# Patient Record
Sex: Male | Born: 1965 | Smoking: Current every day smoker
Health system: Southern US, Community
[De-identification: ages and names within clinical notes are randomized; demographics above are authoritative.]

## PROBLEM LIST (undated history)

## (undated) DIAGNOSIS — K219 Gastro-esophageal reflux disease without esophagitis: Secondary | ICD-10-CM

## (undated) DIAGNOSIS — F329 Major depressive disorder, single episode, unspecified: Secondary | ICD-10-CM

## (undated) DIAGNOSIS — M1990 Unspecified osteoarthritis, unspecified site: Secondary | ICD-10-CM

## (undated) DIAGNOSIS — J189 Pneumonia, unspecified organism: Secondary | ICD-10-CM

## (undated) DIAGNOSIS — F32A Depression, unspecified: Secondary | ICD-10-CM

---

## 2013-02-12 HISTORY — PX: OTHER SURGICAL HISTORY: SHX169

## 2018-01-28 ENCOUNTER — Encounter: Payer: Self-pay | Admitting: Student

## 2018-01-28 NOTE — H&P (Signed)
TOTAL HIP ADMISSION H&P  Patient is admitted for right total hip arthroplasty.  Subjective:  Chief Complaint: right hip pain  HPI: Troy Cruz, 52 y.o. male, has a history of pain and functional disability in the right hip(s) due to arthritis and patient has failed non-surgical conservative treatments for greater than 12 weeks to include corticosteriod injections and activity modification.  Onset of symptoms was gradual starting several years ago with gradually worsening course since that time.The patient noted no past surgery on the right hip(s).  Patient currently rates pain in the right hip at 10 out of 10 with activity. Patient has night pain, worsening of pain with activity and weight bearing and pain that interfers with activities of daily living. 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 This condition presents safety issues increasing the risk of falls. There is no current active infection.  There are no active problems to display for this patient.  History reviewed. No pertinent past medical history.  History reviewed. No pertinent surgical history.  No current facility-administered medications for this encounter.    No current outpatient medications on file.   Allergies not on file  Social History   Tobacco Use  . Smoking status: Not on file  Substance Use Topics  . Alcohol use: Not on file    History reviewed. No pertinent family history.   Review of Systems  Constitutional: Negative for chills and fever.  HENT: Negative for congestion, sore throat and tinnitus.   Eyes: Negative for double vision, photophobia and pain.  Respiratory: Negative for cough, shortness of breath and wheezing.   Cardiovascular: Negative for chest pain, palpitations and orthopnea.  Gastrointestinal: Negative for heartburn, nausea and vomiting.  Genitourinary: Negative for dysuria, frequency  and urgency.  Musculoskeletal: Positive for joint pain.  Neurological: Negative for dizziness, weakness and headaches.    Objective:  Physical Exam  Well nourished and well developed.  General: Alert and oriented x3, cooperative and pleasant, no acute distress.  Head: normocephalic, atraumatic, neck supple.  Eyes: EOMI.  Respiratory: breath sounds clear in all fields, no wheezing, rales, or rhonchi. Cardiovascular: Regular rate and rhythm, no murmurs, gallops or rubs.  Abdomen: non-tender to palpation and soft, normoactive bowel sounds. Musculoskeletal: Right Hip Exam: ROM: Flexion to 90, Internal Rotation 0, External Rotation 10, and abduction 10 without pain during any type of motion. There is no tenderness over the greater trochanter bursa. Left Hip Exam: ROM: is normal without discomfort. There is no tenderness over the greater trochanter bursa. There is no pain on provocative testing of the hip. Calves soft and nontender. Motor function intact in LE. Strength 5/5 LE bilaterally. Neuro: Distal pulses 2+. Sensation to light touch intact in LE.  Vital signs in last 24 hours: Blood pressure: 118/84 mmHg Pulse: 60 bpm  Labs:   There is no height or weight on file to calculate BMI.   Imaging Review Plain radiographs demonstrate severe degenerative joint disease of the right hip(s). The bone quality appears to be adequate for age and reported activity level.    Preoperative templating of the joint replacement has been completed, documented, and submitted to the Operating Room personnel in order to optimize intra-operative equipment management.     Assessment/Plan:  End stage arthritis, right hip(s)  The patient history, physical examination, clinical judgement of the provider and imaging studies are consistent with end stage degenerative joint disease of the right hip(s) and total hip arthroplasty  is deemed medically necessary. The treatment options including medical  management, injection therapy, arthroscopy and arthroplasty were discussed at length. The risks and benefits of total hip arthroplasty were presented and reviewed. The risks due to aseptic loosening, infection, stiffness, dislocation/subluxation,  thromboembolic complications and other imponderables were discussed.  The patient acknowledged the explanation, agreed to proceed with the plan and consent was signed. Patient is being admitted for inpatient treatment for surgery, pain control, PT, OT, prophylactic antibiotics, VTE prophylaxis, progressive ambulation and ADL's and discharge planning.The patient is planning to be discharged home.   Therapy Plans: HEP Disposition: Home with wife Planned DVT Prophylaxis: Xarelto 10 mg daily (hx gastric ulcer) DME needed: Dan HumphreysWalker, 3-n-1 PCP: Dr. Christean GriefWhitten TXA: IV Allergies: NKDA Anesthesia Concerns: None  - Patient was instructed on what medications to stop prior to surgery. - Follow-up visit in 2 weeks with Dr. Lequita HaltAluisio - Begin physical therapy following surgery - Pre-operative lab work as pre-surgical testing - Prescriptions will be provided in hospital at time of discharge  Arther AbbottKristie Edmisten, PA-C Orthopedic Surgery [facility] [place]

## 2018-02-10 ENCOUNTER — Other Ambulatory Visit (HOSPITAL_COMMUNITY): Payer: Self-pay | Admitting: *Deleted

## 2018-02-10 NOTE — Patient Instructions (Addendum)
Troy Cruz  02/10/2018   Your procedure is scheduled on: 02-19-18  [facility]  Report to admitting at 930 AM    Call this number if you have problems the morning of surgery [phone]   Remember: Do not eat food or drink liquids :After Midnight. BRUSH YOUR TEETH MORNING OF SURGERY AND RINSE YOUR MOUTH OUT, NO CHEWING GUM CANDY OR MINTS.     Take these medicines the morning of surgery with A SIP OF WATER: omeprazole                               You may not have any metal on your body including hair pins and              piercings  Do not wear jewelry, make-up, lotions, powders or perfumes, deodorant             Do not wear nail polish.  Do not shave  48 hours prior to surgery.              Men may shave face and neck.   Do not bring valuables to the hospital. [place] IS NOT             RESPONSIBLE   FOR VALUABLES.  Contacts, dentures or bridgework may not be worn into surgery.  Leave suitcase in the car. After surgery it may be brought to your room.                  Please read over the following fact sheets you were given: _____________________________________________________________________             [facility] Pre[facility] Before surgery, you can play an important role.  Because skin is not sterile, your skin needs to be as free of germs as possible.  You can reduce the number of germs on your skin by washing with CHG (chlorahexidine gluconate) soap before surgery.  CHG is an antiseptic cleaner which kills germs and bonds with the skin to continue killing germs even after washing. Please DO NOT use if you have an allergy to CHG or antibacterial soaps.  If your skin becomes reddened/irritated stop using the CHG and inform your nurse when you arrive at Short Stay. Do not shave (including legs and underarms) for at least 48 hours prior to the first CHG shower.  You may shave your face/neck. Please follow  these instructions carefully:  1.  Shower with CHG Soap the night before surgery and the  morning of Surgery.  2.  If you choose to wash your hair, wash your hair first as usual with your  normal  shampoo.  3.  After you shampoo, rinse your hair and body thoroughly to remove the  shampoo.                           4.  Use CHG as you would any other liquid soap.  You can apply chg directly  to the skin and wash                       Gently with a scrungie or clean washcloth.  5.  Apply the CHG Soap to your body ONLY FROM THE NECK DOWN.  Do not use on face/ open                           Wound or open sores. Avoid contact with eyes, ears mouth and genitals (private parts).                       Wash face,  Genitals (private parts) with your normal soap.             6.  Wash thoroughly, paying special attention to the area where your surgery  will be performed.  7.  Thoroughly rinse your body with warm water from the neck down.  8.  DO NOT shower/wash with your normal soap after using and rinsing off  the CHG Soap.                9.  Pat yourself dry with a clean towel.            10.  Wear clean pajamas.            11.  Place clean sheets on your bed the night of your first shower and do not  sleep with pets. Day of Surgery : Do not apply any lotions/deodorants the morning of surgery.  Please wear clean clothes to the hospital/surgery center.  FAILURE TO FOLLOW THESE INSTRUCTIONS MAY RESULT IN THE CANCELLATION OF YOUR SURGERY PATIENT SIGNATURE_________________________________  NURSE SIGNATURE__________________________________  ________________________________________________________________________   Adam Phenix  An incentive spirometer is a tool that can help keep your lungs clear and active. This tool measures how well you are filling your lungs with each breath. Taking long deep breaths may help reverse or decrease the chance of developing breathing (pulmonary) problems  (especially infection) following:  A long period of time when you are unable to move or be active. BEFORE THE PROCEDURE   If the spirometer includes an indicator to show your best effort, your nurse or respiratory therapist will set it to a desired goal.  If possible, sit up straight or lean slightly forward. Try not to slouch.  Hold the incentive spirometer in an upright position. INSTRUCTIONS FOR USE  1. Sit on the edge of your bed if possible, or sit up as far as you can in bed or on a chair. 2. Hold the incentive spirometer in an upright position. 3. Breathe out normally. 4. Place the mouthpiece in your mouth and seal your lips tightly around it. 5. Breathe in slowly and as deeply as possible, raising the piston or the ball toward the top of the column. 6. Hold your breath for 3-5 seconds or for as long as possible. Allow the piston or ball to fall to the bottom of the column. 7. Remove the mouthpiece from your mouth and breathe out normally. 8. Rest for a few seconds and repeat Steps 1 through 7 at least 10 times every 1-2 hours when you are awake. Take your time and take a few normal breaths between deep breaths. 9. The spirometer may include an indicator to show your best effort. Use the indicator as a goal to work toward during each repetition. 10. After each set of 10 deep breaths, practice coughing to be sure your lungs are clear. If you have an incision (the cut made at the time of surgery), support your incision when coughing by placing a pillow or rolled up towels firmly against it. Once you are able to get out of  bed, walk around indoors and cough well. You may stop using the incentive spirometer when instructed by your caregiver.  RISKS AND COMPLICATIONS  Take your time so you do not get dizzy or light-headed.  If you are in pain, you may need to take or ask for pain medication before doing incentive spirometry. It is harder to take a deep breath if you are having  pain. AFTER USE  Rest and breathe slowly and easily.  It can be helpful to keep track of a log of your progress. Your caregiver can provide you with a simple table to help with this. If you are using the spirometer at home, follow these instructions: [place] IF:   You are having difficultly using the spirometer.  You have trouble using the spirometer as often as instructed.  Your pain medication is not giving enough relief while using the spirometer.  You develop fever of 100.5 F (38.1 C) or higher. SEEK IMMEDIATE MEDICAL CARE IF:   You cough up bloody sputum that had not been present before.  You develop fever of 102 F (38.9 C) or greater.  You develop worsening pain at or near the incision site. MAKE SURE YOU:   Understand these instructions.  Will watch your condition.  Will get help right away if you are not doing well or get worse. Document Released: 06/11/2006 Document Revised: 04/23/2011 Document Reviewed: 08/12/2006 ExitCare Patient Information 2014 ExitCare, [place].   ________________________________________________________________________  WHAT IS A BLOOD TRANSFUSION? Blood Transfusion Information  A transfusion is the replacement of blood or some of its parts. Blood is made up of multiple cells which provide different functions.  Red blood cells carry oxygen and are used for blood loss replacement.  White blood cells fight against infection.  Platelets control bleeding.  Plasma helps clot blood.  Other blood products are available for specialized needs, such as hemophilia or other clotting disorders. BEFORE THE TRANSFUSION  Who gives blood for transfusions?   Healthy volunteers who are fully evaluated to make sure their blood is safe. This is blood bank blood. Transfusion therapy is the safest it has ever been in the practice of medicine. Before blood is taken from a donor, a complete history is taken to make sure that person has no history  of diseases nor engages in risky social behavior (examples are intravenous drug use or sexual activity with multiple partners). The donor's travel history is screened to minimize risk of transmitting infections, such as malaria. The donated blood is tested for signs of infectious diseases, such as HIV and hepatitis. The blood is then tested to be sure it is compatible with you in order to minimize the chance of a transfusion reaction. If you or a relative donates blood, this is often done in anticipation of surgery and is not appropriate for emergency situations. It takes many days to process the donated blood. RISKS AND COMPLICATIONS Although transfusion therapy is very safe and saves many lives, the main dangers of transfusion include:   Getting an infectious disease.  Developing a transfusion reaction. This is an allergic reaction to something in the blood you were given. Every precaution is taken to prevent this. The decision to have a blood transfusion has been considered carefully by your caregiver before blood is given. Blood is not given unless the benefits outweigh the risks. AFTER THE TRANSFUSION  Right after receiving a blood transfusion, you will usually feel much better and more energetic. This is especially true if your red blood  cells have gotten low (anemic). The transfusion raises the level of the red blood cells which carry oxygen, and this usually causes an energy increase.  The nurse administering the transfusion will monitor you carefully for complications. HOME CARE INSTRUCTIONS  No special instructions are needed after a transfusion. You may find your energy is better. Speak with your caregiver about any limitations on activity for underlying diseases you may have. SEEK MEDICAL CARE IF:   Your condition is not improving after your transfusion.  You develop redness or irritation at the intravenous (IV) site. SEEK IMMEDIATE MEDICAL CARE IF:  Any of the following symptoms  occur over the next 12 hours:  Shaking chills.  You have a temperature by mouth above 102 F (38.9 C), not controlled by medicine.  Chest, back, or muscle pain.  People around you feel you are not acting correctly or are confused.  Shortness of breath or difficulty breathing.  Dizziness and fainting.  You get a rash or develop hives.  You have a decrease in urine output.  Your urine turns a dark color or changes to pink, red, or brown. Any of the following symptoms occur over the next 10 days:  You have a temperature by mouth above 102 F (38.9 C), not controlled by medicine.  Shortness of breath.  Weakness after normal activity.  The white part of the eye turns yellow (jaundice).  You have a decrease in the amount of urine or are urinating less often.  Your urine turns a dark color or changes to pink, red, or brown. Document Released: 01/27/2000 Document Revised: 04/23/2011 Document Reviewed: 09/15/2007 [facility] Patient Information 2014 [place], [place].  _______________________________________________________________________

## 2018-02-14 ENCOUNTER — Encounter (HOSPITAL_COMMUNITY)
Admission: RE | Admit: 2018-02-14 | Discharge: 2018-02-14 | Disposition: A | Discharge status: Home / Self Care | Payer: Commercial Managed Care - PPO | Source: Ambulatory Visit | Attending: Orthopedic Surgery | Admitting: Orthopedic Surgery

## 2018-02-14 ENCOUNTER — Encounter (HOSPITAL_COMMUNITY): Payer: Self-pay

## 2018-02-14 ENCOUNTER — Other Ambulatory Visit: Payer: Self-pay

## 2018-02-14 DIAGNOSIS — M1611 Unilateral primary osteoarthritis, right hip: Secondary | ICD-10-CM | POA: Insufficient documentation

## 2018-02-14 DIAGNOSIS — Z01812 Encounter for preprocedural laboratory examination: Secondary | ICD-10-CM | POA: Insufficient documentation

## 2018-02-14 HISTORY — DX: Depression, unspecified: F32.A

## 2018-02-14 HISTORY — DX: Major depressive disorder, single episode, unspecified: F32.9

## 2018-02-14 HISTORY — DX: Unspecified osteoarthritis, unspecified site: M19.90

## 2018-02-14 HISTORY — DX: Pneumonia, unspecified organism: J18.9

## 2018-02-14 HISTORY — DX: Gastro-esophageal reflux disease without esophagitis: K21.9

## 2018-02-14 LAB — APTT: APTT: 35 s (ref 24–36)

## 2018-02-14 LAB — COMPREHENSIVE METABOLIC PANEL
ALBUMIN: 4 g/dL (ref 3.5–5.0)
ALK PHOS: 92 U/L (ref 38–126)
ALT: 19 U/L (ref 0–44)
ANION GAP: 12 (ref 5–15)
AST: 26 U/L (ref 15–41)
BUN: 12 mg/dL (ref 6–20)
CHLORIDE: 103 mmol/L (ref 98–111)
CO2: 24 mmol/L (ref 22–32)
Calcium: 9.5 mg/dL (ref 8.9–10.3)
Creatinine, Ser: 0.9 mg/dL (ref 0.61–1.24)
GFR calc Af Amer: >60 mL/min (ref >60)
GFR calc non Af Amer: >60 mL/min (ref >60)
GLUCOSE: 93 mg/dL (ref 70–99)
POTASSIUM: 4.7 mmol/L (ref 3.5–5.1)
SODIUM: 139 mmol/L (ref 135–145)
Total Bilirubin: 0.5 mg/dL (ref 0.3–1.2)
Total Protein: 7.3 g/dL (ref 6.5–8.1)

## 2018-02-14 LAB — CBC
HCT: 47.5 % (ref 39.0–52.0)
HEMOGLOBIN: 15.4 g/dL (ref 13.0–17.0)
MCH: 28.6 pg (ref 26.0–34.0)
MCHC: 32.4 g/dL (ref 30.0–36.0)
MCV: 88.3 fL (ref 80.0–100.0)
Platelets: 219 10*3/uL (ref 150–400)
RBC: 5.38 MIL/uL (ref 4.22–5.81)
RDW: 14.2 % (ref 11.5–15.5)
WBC: 10.8 10*3/uL — AB (ref 4.0–10.5)
nRBC: 0 % (ref 0.0–0.2)

## 2018-02-14 LAB — PROTIME-INR
INR: 0.92
Prothrombin Time: 12.3 seconds (ref 11.4–15.2)

## 2018-02-14 LAB — ABO/RH: ABO/RH(D): A POS

## 2018-02-14 LAB — SURGICAL PCR SCREEN
MRSA, PCR: NEGATIVE
Staphylococcus aureus: NEGATIVE

## 2018-02-19 ENCOUNTER — Inpatient Hospital Stay (HOSPITAL_COMMUNITY): Payer: Commercial Managed Care - PPO | Admitting: Certified Registered Nurse Anesthetist

## 2018-02-19 ENCOUNTER — Inpatient Hospital Stay (HOSPITAL_COMMUNITY)
Admission: RE | Admit: 2018-02-19 | Discharge: 2018-02-20 | DRG: 470 | Disposition: A | Discharge status: Home / Self Care | Payer: Commercial Managed Care - PPO | Attending: Orthopedic Surgery | Admitting: Orthopedic Surgery

## 2018-02-19 ENCOUNTER — Other Ambulatory Visit: Payer: Self-pay

## 2018-02-19 ENCOUNTER — Inpatient Hospital Stay (HOSPITAL_COMMUNITY): Payer: Commercial Managed Care - PPO

## 2018-02-19 ENCOUNTER — Encounter (HOSPITAL_COMMUNITY): Payer: Self-pay | Admitting: Certified Registered Nurse Anesthetist

## 2018-02-19 ENCOUNTER — Encounter (HOSPITAL_COMMUNITY)
Admission: RE | Disposition: A | Discharge status: Home / Self Care | Payer: Self-pay | Source: Home / Self Care | Attending: Orthopedic Surgery

## 2018-02-19 ENCOUNTER — Inpatient Hospital Stay (HOSPITAL_COMMUNITY): Payer: Commercial Managed Care - PPO | Admitting: Physician Assistant

## 2018-02-19 DIAGNOSIS — K219 Gastro-esophageal reflux disease without esophagitis: Secondary | ICD-10-CM | POA: Diagnosis present

## 2018-02-19 DIAGNOSIS — M1611 Unilateral primary osteoarthritis, right hip: Principal | ICD-10-CM | POA: Diagnosis present

## 2018-02-19 DIAGNOSIS — M25761 Osteophyte, right knee: Secondary | ICD-10-CM | POA: Diagnosis present

## 2018-02-19 DIAGNOSIS — F17200 Nicotine dependence, unspecified, uncomplicated: Secondary | ICD-10-CM | POA: Diagnosis present

## 2018-02-19 DIAGNOSIS — Z8711 Personal history of peptic ulcer disease: Secondary | ICD-10-CM | POA: Diagnosis not present

## 2018-02-19 DIAGNOSIS — Z972 Presence of dental prosthetic device (complete) (partial): Secondary | ICD-10-CM

## 2018-02-19 DIAGNOSIS — F329 Major depressive disorder, single episode, unspecified: Secondary | ICD-10-CM | POA: Diagnosis present

## 2018-02-19 DIAGNOSIS — Z96649 Presence of unspecified artificial hip joint: Secondary | ICD-10-CM

## 2018-02-19 DIAGNOSIS — M169 Osteoarthritis of hip, unspecified: Secondary | ICD-10-CM | POA: Diagnosis present

## 2018-02-19 DIAGNOSIS — Z419 Encounter for procedure for purposes other than remedying health state, unspecified: Secondary | ICD-10-CM

## 2018-02-19 HISTORY — PX: TOTAL HIP ARTHROPLASTY: SHX124

## 2018-02-19 LAB — TYPE AND SCREEN
ABO/RH(D): A POS
ANTIBODY SCREEN: NEGATIVE

## 2018-02-19 SURGERY — ARTHROPLASTY, HIP, TOTAL, ANTERIOR APPROACH
Anesthesia: Monitor Anesthesia Care | Site: Hip | Laterality: Right

## 2018-02-19 MED ORDER — CHLORHEXIDINE GLUCONATE 4 % EX LIQD: 60.0000 mL | Freq: Once | CUTANEOUS | Status: DC

## 2018-02-19 MED ORDER — CEFAZOLIN SODIUM-DEXTROSE 1-4 GM/50ML-% IV SOLN
1.0000 g | Freq: Four times a day (QID) | INTRAVENOUS | Status: AC
  Administered 2018-02-19 (×2): 1 g via INTRAVENOUS
  Filled 2018-02-19 (×2): qty 50

## 2018-02-19 MED ORDER — FENTANYL CITRATE (PF) 100 MCG/2ML IJ SOLN: 25.0000 ug | INTRAMUSCULAR | Status: DC | PRN

## 2018-02-19 MED ORDER — STERILE WATER FOR IRRIGATION IR SOLN
Status: DC | PRN
  Administered 2018-02-19: 2000 mL

## 2018-02-19 MED ORDER — FLEET ENEMA 7-19 GM/118ML RE ENEM: 1.0000 | ENEMA | Freq: Once | RECTAL | Status: DC | PRN

## 2018-02-19 MED ORDER — PANTOPRAZOLE SODIUM 40 MG PO TBEC
80.0000 mg | DELAYED_RELEASE_TABLET | Freq: Every day | ORAL | Status: DC
  Administered 2018-02-20: 80 mg via ORAL
  Filled 2018-02-19: qty 2

## 2018-02-19 MED ORDER — FLUTICASONE PROPIONATE 50 MCG/ACT NA SUSP
2.0000 | Freq: Every day | NASAL | Status: DC | PRN
  Filled 2018-02-19: qty 16

## 2018-02-19 MED ORDER — MENTHOL 3 MG MT LOZG: 1.0000 | LOZENGE | OROMUCOSAL | Status: DC | PRN

## 2018-02-19 MED ORDER — CEFAZOLIN SODIUM-DEXTROSE 2-4 GM/100ML-% IV SOLN
2.0000 g | INTRAVENOUS | Status: AC
  Administered 2018-02-19: 2 g via INTRAVENOUS
  Filled 2018-02-19: qty 100

## 2018-02-19 MED ORDER — BISACODYL 10 MG RE SUPP: 10.0000 mg | Freq: Every day | RECTAL | Status: DC | PRN

## 2018-02-19 MED ORDER — HYDROCODONE-ACETAMINOPHEN 7.5-325 MG PO TABS
1.0000 | ORAL_TABLET | ORAL | Status: DC | PRN
  Administered 2018-02-19 – 2018-02-20 (×3): 2 via ORAL
  Filled 2018-02-19 (×3): qty 2

## 2018-02-19 MED ORDER — SODIUM CHLORIDE 0.9 % IV SOLN
INTRAVENOUS | Status: DC
  Administered 2018-02-19 (×2): via INTRAVENOUS

## 2018-02-19 MED ORDER — PROPOFOL 500 MG/50ML IV EMUL
INTRAVENOUS | Status: DC | PRN
  Administered 2018-02-19: 125 ug/kg/min via INTRAVENOUS

## 2018-02-19 MED ORDER — ACETAMINOPHEN 160 MG/5ML PO SOLN: 1000.0000 mg | Freq: Once | ORAL | Status: DC | PRN

## 2018-02-19 MED ORDER — PHENOL 1.4 % MT LIQD
1.0000 | OROMUCOSAL | Status: DC | PRN
  Filled 2018-02-19: qty 177

## 2018-02-19 MED ORDER — ACETAMINOPHEN 500 MG PO TABS
500.0000 mg | ORAL_TABLET | Freq: Four times a day (QID) | ORAL | Status: DC
  Administered 2018-02-19 (×2): 500 mg via ORAL
  Filled 2018-02-19 (×3): qty 1

## 2018-02-19 MED ORDER — TRANEXAMIC ACID-NACL 1000-0.7 MG/100ML-% IV SOLN
1000.0000 mg | Freq: Once | INTRAVENOUS | Status: AC
  Administered 2018-02-19: 1000 mg via INTRAVENOUS
  Filled 2018-02-19: qty 100

## 2018-02-19 MED ORDER — DEXAMETHASONE SODIUM PHOSPHATE 10 MG/ML IJ SOLN
8.0000 mg | Freq: Once | INTRAMUSCULAR | Status: AC
  Administered 2018-02-19: 10 mg via INTRAVENOUS

## 2018-02-19 MED ORDER — BUPIVACAINE-EPINEPHRINE (PF) 0.25% -1:200000 IJ SOLN
INTRAMUSCULAR | Status: DC | PRN
  Administered 2018-02-19: 30 mL

## 2018-02-19 MED ORDER — ONDANSETRON HCL 4 MG/2ML IJ SOLN
INTRAMUSCULAR | Status: DC | PRN
  Administered 2018-02-19: 4 mg via INTRAVENOUS

## 2018-02-19 MED ORDER — PROPOFOL 10 MG/ML IV BOLUS
INTRAVENOUS | Status: AC
  Filled 2018-02-19: qty 60

## 2018-02-19 MED ORDER — ACETAMINOPHEN 10 MG/ML IV SOLN: 1000.0000 mg | Freq: Once | INTRAVENOUS | Status: DC | PRN

## 2018-02-19 MED ORDER — RIVAROXABAN 10 MG PO TABS
10.0000 mg | ORAL_TABLET | Freq: Every day | ORAL | Status: DC
  Administered 2018-02-20: 10 mg via ORAL
  Filled 2018-02-19: qty 1

## 2018-02-19 MED ORDER — OXYCODONE HCL 5 MG/5ML PO SOLN
5.0000 mg | Freq: Once | ORAL | Status: DC | PRN
  Filled 2018-02-19: qty 5

## 2018-02-19 MED ORDER — METOCLOPRAMIDE HCL 5 MG PO TABS: 5.0000 mg | ORAL_TABLET | Freq: Three times a day (TID) | ORAL | Status: DC | PRN

## 2018-02-19 MED ORDER — DOCUSATE SODIUM 100 MG PO CAPS
100.0000 mg | ORAL_CAPSULE | Freq: Two times a day (BID) | ORAL | Status: DC
  Administered 2018-02-20: 100 mg via ORAL
  Filled 2018-02-19: qty 1

## 2018-02-19 MED ORDER — METHOCARBAMOL 500 MG IVPB - SIMPLE MED
500.0000 mg | Freq: Four times a day (QID) | INTRAVENOUS | Status: DC | PRN
  Filled 2018-02-19: qty 50

## 2018-02-19 MED ORDER — ACETAMINOPHEN 10 MG/ML IV SOLN
1000.0000 mg | Freq: Four times a day (QID) | INTRAVENOUS | Status: DC
  Administered 2018-02-19: 1000 mg via INTRAVENOUS
  Filled 2018-02-19: qty 100

## 2018-02-19 MED ORDER — METOCLOPRAMIDE HCL 5 MG/ML IJ SOLN: 5.0000 mg | Freq: Three times a day (TID) | INTRAMUSCULAR | Status: DC | PRN

## 2018-02-19 MED ORDER — PROPOFOL 10 MG/ML IV BOLUS
INTRAVENOUS | Status: DC | PRN
  Administered 2018-02-19: 40 mg via INTRAVENOUS

## 2018-02-19 MED ORDER — HYDROCODONE-ACETAMINOPHEN 5-325 MG PO TABS
1.0000 | ORAL_TABLET | ORAL | Status: DC | PRN
  Administered 2018-02-19: 2 via ORAL
  Filled 2018-02-19: qty 2

## 2018-02-19 MED ORDER — POLYETHYLENE GLYCOL 3350 17 G PO PACK: 17.0000 g | PACK | Freq: Every day | ORAL | Status: DC | PRN

## 2018-02-19 MED ORDER — BUPIVACAINE-EPINEPHRINE (PF) 0.25% -1:200000 IJ SOLN
INTRAMUSCULAR | Status: AC
  Filled 2018-02-19: qty 30

## 2018-02-19 MED ORDER — ACETAMINOPHEN 500 MG PO TABS: 1000.0000 mg | ORAL_TABLET | Freq: Once | ORAL | Status: DC | PRN

## 2018-02-19 MED ORDER — ONDANSETRON HCL 4 MG/2ML IJ SOLN: 4.0000 mg | Freq: Four times a day (QID) | INTRAMUSCULAR | Status: DC | PRN

## 2018-02-19 MED ORDER — TRANEXAMIC ACID-NACL 1000-0.7 MG/100ML-% IV SOLN
1000.0000 mg | INTRAVENOUS | Status: AC
  Administered 2018-02-19: 1000 mg via INTRAVENOUS
  Filled 2018-02-19: qty 100

## 2018-02-19 MED ORDER — MORPHINE SULFATE (PF) 2 MG/ML IV SOLN: 0.5000 mg | INTRAVENOUS | Status: DC | PRN

## 2018-02-19 MED ORDER — DIPHENHYDRAMINE HCL 12.5 MG/5ML PO ELIX: 12.5000 mg | ORAL_SOLUTION | ORAL | Status: DC | PRN

## 2018-02-19 MED ORDER — DEXAMETHASONE SODIUM PHOSPHATE 10 MG/ML IJ SOLN
10.0000 mg | Freq: Once | INTRAMUSCULAR | Status: AC
  Administered 2018-02-20: 10 mg via INTRAVENOUS
  Filled 2018-02-19: qty 1

## 2018-02-19 MED ORDER — ONDANSETRON HCL 4 MG PO TABS: 4.0000 mg | ORAL_TABLET | Freq: Four times a day (QID) | ORAL | Status: DC | PRN

## 2018-02-19 MED ORDER — LACTATED RINGERS IV SOLN
INTRAVENOUS | Status: DC
  Administered 2018-02-19 (×2): via INTRAVENOUS

## 2018-02-19 MED ORDER — 0.9 % SODIUM CHLORIDE (POUR BTL) OPTIME
TOPICAL | Status: DC | PRN
  Administered 2018-02-19: 1000 mL

## 2018-02-19 MED ORDER — OXYCODONE HCL 5 MG PO TABS: 5.0000 mg | ORAL_TABLET | Freq: Once | ORAL | Status: DC | PRN

## 2018-02-19 MED ORDER — METHOCARBAMOL 500 MG PO TABS
500.0000 mg | ORAL_TABLET | Freq: Four times a day (QID) | ORAL | Status: DC | PRN
  Administered 2018-02-19 – 2018-02-20 (×2): 500 mg via ORAL
  Filled 2018-02-19 (×2): qty 1

## 2018-02-19 MED ORDER — BUPIVACAINE IN DEXTROSE 0.75-8.25 % IT SOLN
INTRATHECAL | Status: DC | PRN
  Administered 2018-02-19: 15 mg via INTRATHECAL

## 2018-02-19 MED ORDER — TERBINAFINE HCL 250 MG PO TABS
250.0000 mg | ORAL_TABLET | Freq: Every evening | ORAL | Status: DC
  Administered 2018-02-19: 250 mg via ORAL
  Filled 2018-02-19: qty 1

## 2018-02-19 SURGICAL SUPPLY — 42 items
BAG DECANTER FOR FLEXI CONT (MISCELLANEOUS) ×3 IMPLANT
BAG ZIPLOCK 12X15 (MISCELLANEOUS) IMPLANT
BLADE SAG 18X100X1.27 (BLADE) ×3 IMPLANT
BLADE SURG SZ10 CARB STEEL (BLADE) ×6 IMPLANT
CLOSURE WOUND 1/2 X4 (GAUZE/BANDAGES/DRESSINGS) ×1
COVER PERINEAL POST (MISCELLANEOUS) ×3 IMPLANT
COVER SURGICAL LIGHT HANDLE (MISCELLANEOUS) ×3 IMPLANT
COVER WAND RF STERILE (DRAPES) ×2 IMPLANT
CUP ACETBLR 54 OD PINNACLE (Hips) ×2 IMPLANT
DECANTER SPIKE VIAL GLASS SM (MISCELLANEOUS) ×3 IMPLANT
DRAPE STERI IOBAN 125X83 (DRAPES) ×3 IMPLANT
DRAPE U-SHAPE 47X51 STRL (DRAPES) ×6 IMPLANT
DRSG ADAPTIC 3X8 NADH LF (GAUZE/BANDAGES/DRESSINGS) ×3 IMPLANT
DRSG MEPILEX BORDER 4X4 (GAUZE/BANDAGES/DRESSINGS) ×3 IMPLANT
DRSG MEPILEX BORDER 4X8 (GAUZE/BANDAGES/DRESSINGS) ×3 IMPLANT
DURAPREP 26ML APPLICATOR (WOUND CARE) ×3 IMPLANT
ELECT REM PT RETURN 15FT ADLT (MISCELLANEOUS) ×3 IMPLANT
EVACUATOR 1/8 PVC DRAIN (DRAIN) ×3 IMPLANT
GLOVE BIO SURGEON STRL SZ7 (GLOVE) ×3 IMPLANT
GLOVE BIO SURGEON STRL SZ8 (GLOVE) ×3 IMPLANT
GLOVE BIOGEL PI IND STRL 7.0 (GLOVE) ×1 IMPLANT
GLOVE BIOGEL PI IND STRL 8 (GLOVE) ×1 IMPLANT
GLOVE BIOGEL PI INDICATOR 7.0 (GLOVE) ×2
GLOVE BIOGEL PI INDICATOR 8 (GLOVE) ×2
GOWN STRL REUS W/TWL LRG LVL3 (GOWN DISPOSABLE) ×3 IMPLANT
GOWN STRL REUS W/TWL XL LVL3 (GOWN DISPOSABLE) ×3 IMPLANT
HEAD CERAMIC 36 PLUS5 (Hips) ×2 IMPLANT
HOLDER FOLEY CATH W/STRAP (MISCELLANEOUS) ×3 IMPLANT
LINER MARATHON NEUT +4X54X36 (Hips) ×2 IMPLANT
MANIFOLD NEPTUNE II (INSTRUMENTS) ×3 IMPLANT
PACK ANTERIOR HIP CUSTOM (KITS) ×3 IMPLANT
STEM FEMORAL SZ5 HIGH ACTIS (Nail) ×2 IMPLANT
STRIP CLOSURE SKIN 1/2X4 (GAUZE/BANDAGES/DRESSINGS) ×2 IMPLANT
SUT ETHIBOND NAB CT1 #1 30IN (SUTURE) ×3 IMPLANT
SUT MNCRL AB 4-0 PS2 18 (SUTURE) ×3 IMPLANT
SUT STRATAFIX 0 PDS 27 VIOLET (SUTURE) ×3
SUT VIC AB 2-0 CT1 27 (SUTURE) ×4
SUT VIC AB 2-0 CT1 TAPERPNT 27 (SUTURE) ×2 IMPLANT
SUTURE STRATFX 0 PDS 27 VIOLET (SUTURE) ×1 IMPLANT
SYR 50ML LL SCALE MARK (SYRINGE) IMPLANT
TRAY FOLEY MTR SLVR 16FR STAT (SET/KITS/TRAYS/PACK) ×3 IMPLANT
YANKAUER SUCT BULB TIP 10FT TU (MISCELLANEOUS) ×3 IMPLANT

## 2018-02-19 NOTE — Anesthesia Procedure Notes (Signed)
Spinal  Patient location during procedure: OR Start time: 02/19/2018 10:48 AM Staffing Resident/CRNA: [place] Karver Fadden C, CRNA Performed: resident/CRNA  Preanesthetic Checklist Completed: patient identified, site marked, surgical consent, pre-op evaluation, timeout performed, IV checked, risks and benefits discussed and monitors and equipment checked Spinal Block Patient position: sitting Prep: site prepped and draped and DuraPrep Patient monitoring: heart rate, continuous pulse ox and blood pressure Approach: midline Location: L3-4 Injection technique: single-shot Needle Needle type: Pencan  Needle gauge: 25 G Needle length: 9 cm Assessment Sensory level: T6 Additional Notes Expiration date of kit checked and confirmed. Patient tolerated procedure well, without complications.

## 2018-02-19 NOTE — Op Note (Signed)
OPERATIVE REPORT- TOTAL HIP ARTHROPLASTY   PREOPERATIVE DIAGNOSIS: Osteoarthritis of the Right hip.   POSTOPERATIVE DIAGNOSIS: Osteoarthritis of the Right  hip.   PROCEDURE: Right total hip arthroplasty, anterior approach.   SURGEON: Ollen Gross, MD   ASSISTANT: Dennie Bible, PA-C  ANESTHESIA:  Spinal  ESTIMATED BLOOD LOSS:-350 mL    DRAINS: Hemovac x1.   COMPLICATIONS: None   CONDITION: PACU - hemodynamically stable.   BRIEF CLINICAL NOTE: Troy Cruz is a 53 y.o. male who has advanced end-  stage arthritis of their Right  hip with progressively worsening pain and  dysfunction.The patient has failed nonoperative management and presents for  total hip arthroplasty.   PROCEDURE IN DETAIL: After successful administration of spinal  anesthetic, the traction boots for the [facility] bed were placed on both  feet and the patient was placed onto the [facility] bed, boots placed into the leg  holders. The Right hip was then isolated from the perineum with plastic  drapes and prepped and draped in the usual sterile fashion. ASIS and  greater trochanter were marked and a oblique incision was made, starting  at about 1 cm lateral and 2 cm distal to the ASIS and coursing towards  the anterior cortex of the femur. The skin was cut with a 10 blade  through subcutaneous tissue to the level of the fascia overlying the  tensor fascia lata muscle. The fascia was then incised in line with the  incision at the junction of the anterior third and posterior 2/3rd. The  muscle was teased off the fascia and then the interval between the TFL  and the rectus was developed. The Hohmann retractor was then placed at  the top of the femoral neck over the capsule. The vessels overlying the  capsule were cauterized and the fat on top of the capsule was removed.  A Hohmann retractor was then placed anterior underneath the rectus  femoris to give exposure to the entire anterior capsule. A T-shaped   capsulotomy was performed. The edges were tagged and the femoral head  was identified.       Osteophytes are removed off the superior acetabulum.  The femoral neck was then cut in situ with an oscillating saw. Traction  was then applied to the left lower extremity utilizing the [facility]  traction. The femoral head was then removed. Retractors were placed  around the acetabulum and then circumferential removal of the labrum was  performed. Osteophytes were also removed. Reaming starts at 49 mm to  medialize and  Increased in 2 mm increments to 53 mm. We reamed in  approximately 40 degrees of abduction, 20 degrees anteversion. A 54 mm  pinnacle acetabular shell was then impacted in anatomic position under  fluoroscopic guidance with excellent purchase. We did not need to place  any additional dome screws. A 36 mm neutral + 4 marathon liner was then  placed into the acetabular shell.       The femoral lift was then placed along the lateral aspect of the femur  just distal to the vastus ridge. The leg was  externally rotated and capsule  was stripped off the inferior aspect of the femoral neck down to the  level of the lesser trochanter, this was done with electrocautery. The femur was lifted after this was performed. The  leg was then placed in an extended and adducted position essentially delivering the femur. We also removed the capsule superiorly and the piriformis from the piriformis fossa  to gain excellent exposure of the  proximal femur. Rongeur was used to remove some cancellous bone to get  into the lateral portion of the proximal femur for placement of the  initial starter reamer. The starter broaches was placed  the starter broach  and was shown to go down the center of the canal. Broaching  with the Actis system was then performed starting at size 0  coursing  Up to size 5. A size 5 had excellent torsional and rotational  and axial stability. The trial high offset neck was then placed   with a 36 + 5 trial head. The hip was then reduced. We confirmed that  the stem was in the canal both on AP and lateral x-rays. It also has excellent sizing. The hip was reduced with outstanding stability through full extension and full external rotation.. AP pelvis was taken and the leg lengths were measured and found to be equal. Hip was then dislocated again and the femoral head and neck removed. The  femoral broach was removed. Size 5 Actis stem with a high offset  neck was then impacted into the femur following native anteversion. Has  excellent purchase in the canal. Excellent torsional and rotational and  axial stability. It is confirmed to be in the canal on AP and lateral  fluoroscopic views. The 36 + 5 ceramic head was placed and the hip  reduced with outstanding stability. Again AP pelvis was taken and it  confirmed that the leg lengths were equal. The wound was then copiously  irrigated with saline solution and the capsule reattached and repaired  with Ethibond suture. 30 ml of .25% Bupivicaine was  injected into the capsule and into the edge of the tensor fascia lata as well as subcutaneous tissue. The fascia overlying the tensor fascia lata was then closed with a running #1 V-Loc. Subcu was closed with interrupted 2-0 Vicryl and subcuticular running 4-0 Monocryl. Incision was cleaned  and dried. Steri-Strips and a bulky sterile dressing applied. Hemovac  drain was hooked to suction and then the patient was awakened and transported to  recovery in stable condition.        Please note that a surgical assistant was a medical necessity for this procedure to perform it in a safe and expeditious manner. Assistant was necessary to provide appropriate retraction of vital neurovascular structures and to prevent femoral fracture and allow for anatomic placement of the prosthesis.  Ollen Gross, M.D.

## 2018-02-19 NOTE — Transfer of Care (Signed)
Immediate Anesthesia Transfer of Care Note  Patient: Troy Cruz  Procedure(s) Performed: RIGHT TOTAL HIP ARTHROPLASTY ANTERIOR APPROACH (Right Hip)  Patient Location: PACU  Anesthesia Type:Spinal  Level of Consciousness: drowsy  Airway & Oxygen Therapy: Patient Spontanous Breathing and Patient connected to face mask  Post-op Assessment: Report given to RN and Post -op Vital signs reviewed and stable  Post vital signs: Reviewed and stable  Last Vitals:  Vitals Value Taken Time  BP 91/55 02/19/2018 12:15 PM  Temp    Pulse 76 02/19/2018 12:17 PM  Resp 14 02/19/2018 12:17 PM  SpO2 96 % 02/19/2018 12:17 PM  Vitals shown include unvalidated device data.  Last Pain:  Vitals:   02/19/18 0936  TempSrc:   PainSc: 0-No pain         Complications: No apparent anesthesia complications

## 2018-02-19 NOTE — Discharge Instructions (Addendum)
°Dr. Frank Aluisio °Total Joint Specialist °[facility] °[address] °[place], [place] [address] °[phone] ° °ANTERIOR APPROACH TOTAL HIP REPLACEMENT POSTOPERATIVE DIRECTIONS ° ° °Hip Rehabilitation, Guidelines Following Surgery  °The results of a hip operation are greatly improved after range of motion and muscle strengthening exercises. Follow all safety measures which are given to protect your hip. If any of these exercises cause increased pain or swelling in your joint, decrease the amount until you are comfortable again. Then slowly increase the exercises. Call your caregiver if you have problems or questions.  ° °HOME CARE INSTRUCTIONS  °• Remove items at home which could result in a fall. This includes throw rugs or furniture in walking pathways.  °· ICE to the affected hip every three hours for 30 minutes at a time and then as needed for pain and swelling.  Continue to use ice on the hip for pain and swelling from surgery. You may notice swelling that will progress down to the foot and ankle.  This is normal after surgery.  Elevate the leg when you are not up walking on it.   °· Continue to use the breathing machine which will help keep your temperature down.  It is common for your temperature to cycle up and down following surgery, especially at night when you are not up moving around and exerting yourself.  The breathing machine keeps your lungs expanded and your temperature down. ° °DIET °You may resume your previous home diet once your are discharged from the hospital. ° °DRESSING / WOUND CARE / SHOWERING °You may shower 3 days after surgery, but keep the wounds dry during showering.  You may use an occlusive plastic wrap (Press'n Seal for example), NO SOAKING/SUBMERGING IN THE BATHTUB.  If the bandage gets wet, change with a clean dry gauze.  If the incision gets wet, pat the wound dry with a clean towel. °You may start showering once you are discharged home but do not submerge the  incision under water. Just pat the incision dry and apply a dry gauze dressing on daily. °Change the surgical dressing daily and reapply a dry dressing each time. ° °ACTIVITY °Walk with your walker as instructed. °Use walker as long as suggested by your caregivers. °Avoid periods of inactivity such as sitting longer than an hour when not asleep. This helps prevent blood clots.  °You may resume a sexual relationship in one month or when given the OK by your doctor.  °You may return to work once you are cleared by your doctor.  °Do not drive a car for 6 weeks or until released by you surgeon.  °Do not drive while taking narcotics. ° °WEIGHT BEARING °Weight bearing as tolerated with assist device (walker, cane, etc) as directed, use it as long as suggested by your surgeon or therapist, typically at least 4-6 weeks. ° °POSTOPERATIVE CONSTIPATION PROTOCOL °Constipation - defined medically as fewer than three stools per week and severe constipation as less than one stool per week. ° °One of the most common issues patients have following surgery is constipation.  Even if you have a regular bowel pattern at home, your normal regimen is likely to be disrupted due to multiple reasons following surgery.  Combination of anesthesia, postoperative narcotics, change in appetite and fluid intake all can affect your bowels.  In order to avoid complications following surgery, here are some recommendations in order to help you during your recovery period. ° °Colace (docusate) - Pick up an over-the-counter form   of Colace or another stool softener and take twice a day as long as you are requiring postoperative pain medications.  Take with a full glass of water daily.  If you experience loose stools or diarrhea, hold the colace until you stool forms back up.  If your symptoms do not get better within 1 week or if they get worse, check with your doctor. ° °Dulcolax (bisacodyl) - Pick up over-the-counter and take as directed by the product  packaging as needed to assist with the movement of your bowels.  Take with a full glass of water.  Use this product as needed if not relieved by Colace only.  ° °MiraLax (polyethylene glycol) - Pick up over-the-counter to have on hand.  MiraLax is a solution that will increase the amount of water in your bowels to assist with bowel movements.  Take as directed and can mix with a glass of water, juice, soda, coffee, or tea.  Take if you go more than two days without a movement. °Do not use MiraLax more than once per day. Call your doctor if you are still constipated or irregular after using this medication for 7 days in a row. ° °If you continue to have problems with postoperative constipation, please contact the office for further assistance and recommendations.  If you experience "the worst abdominal pain ever" or develop nausea or vomiting, please contact the office immediatly for further recommendations for treatment. ° °ITCHING ° If you experience itching with your medications, try taking only a single pain pill, or even half a pain pill at a time.  You can also use Benadryl over the counter for itching or also to help with sleep.  ° °TED HOSE STOCKINGS °Wear the elastic stockings on both legs for three weeks following surgery during the day but you may remove then at night for sleeping. ° °MEDICATIONS °See your medication summary on the “After Visit Summary” that the nursing staff will review with you prior to discharge.  You may have some home medications which will be placed on hold until you complete the course of blood thinner medication.  It is important for you to complete the blood thinner medication as prescribed by your surgeon.  Continue your approved medications as instructed at time of discharge. ° °PRECAUTIONS °If you experience chest pain or shortness of breath - call 911 immediately for transfer to the hospital emergency department.  °If you develop a fever greater that 101 F, purulent drainage  from wound, increased redness or drainage from wound, foul odor from the wound/dressing, or calf pain - CONTACT YOUR SURGEON.   °                                                °FOLLOW-UP APPOINTMENTS °Make sure you keep all of your appointments after your operation with your surgeon and caregivers. You should call the office at the above phone number and make an appointment for approximately two weeks after the date of your surgery or on the date instructed by your surgeon outlined in the "After Visit Summary". ° °RANGE OF MOTION AND STRENGTHENING EXERCISES  °These exercises are designed to help you keep full movement of your hip joint. Follow your caregiver's or physical therapist's instructions. Perform all exercises about fifteen times, three times per day or as directed. Exercise both hips, even if you have   had only one joint replacement. These exercises can be done on a training (exercise) mat, on the floor, on a table or on a bed. Use whatever works the best and is most comfortable for you. Use music or television while you are exercising so that the exercises are a pleasant break in your day. This will make your life better with the exercises acting as a break in routine you can look forward to.  °• Lying on your back, slowly slide your foot toward your buttocks, raising your knee up off the floor. Then slowly slide your foot back down until your leg is straight again.  °• Lying on your back spread your legs as far apart as you can without causing discomfort.  °• Lying on your side, raise your upper leg and foot straight up from the floor as far as is comfortable. Slowly lower the leg and repeat.  °• Lying on your back, tighten up the muscle in the front of your thigh (quadriceps muscles). You can do this by keeping your leg straight and trying to raise your heel off the floor. This helps strengthen the largest muscle supporting your knee.  °• Lying on your back, tighten up the muscles of your buttocks both  with the legs straight and with the knee bent at a comfortable angle while keeping your heel on the floor.  ° °IF YOU ARE TRANSFERRED TO A SKILLED REHAB FACILITY °If the patient is transferred to a skilled rehab facility following release from the hospital, a list of the current medications will be sent to the facility for the patient to continue.  When discharged from the skilled rehab facility, please have the facility set up the patient's Home Health Physical Therapy prior to being released. Also, the skilled facility will be responsible for providing the patient with their medications at time of release from the facility to include their pain medication, the muscle relaxants, and their blood thinner medication. If the patient is still at the rehab facility at time of the two week follow up appointment, the skilled rehab facility will also need to assist the patient in arranging follow up appointment in our office and any transportation needs. ° °MAKE SURE YOU:  °• Understand these instructions.  °• Get help right away if you are not doing well or get worse.  ° ° °Pick up stool softner and laxative for home use following surgery while on pain medications. °Do not submerge incision under water. °Please use good hand washing techniques while changing dressing each day. °May shower starting three days after surgery. °Please use a clean towel to pat the incision dry following showers. °Continue to use ice for pain and swelling after surgery. °Do not use any lotions or creams on the incision until instructed by your surgeon. ° °Information on my medicine - XARELTO® (Rivaroxaban) ° °Why was Xarelto® prescribed for you? °Xarelto® was prescribed for you to reduce the risk of blood clots forming after orthopedic surgery. The medical term for these abnormal blood clots is venous thromboembolism (VTE). ° °What do you need to know about xarelto® ? °Take your Xarelto® ONCE DAILY at the same time every day. °You may take it  either with or without food. ° °If you have difficulty swallowing the tablet whole, you may crush it and mix in applesauce just prior to taking your dose. ° °Take Xarelto® exactly as prescribed by your doctor and DO NOT stop taking Xarelto® without talking to the doctor who prescribed the medication.    Stopping without other VTE prevention medication to take the place of Xarelto® may increase your risk of developing a clot. ° °After discharge, you should have regular check-up appointments with your healthcare provider that is prescribing your Xarelto®.   ° °What do you do if you miss a dose? °If you miss a dose, take it as soon as you remember on the same day then continue your regularly scheduled once daily regimen the next day. Do not take two doses of Xarelto® on the same day.  ° °Important Safety Information °A possible side effect of Xarelto® is bleeding. You should call your healthcare provider right away if you experience any of the following: °? Bleeding from an injury or your nose that does not stop. °? Unusual colored urine (red or dark brown) or unusual colored stools (red or black). °? Unusual bruising for unknown reasons. °? A serious fall or if you hit your head (even if there is no bleeding). ° °Some medicines may interact with Xarelto® and might increase your risk of bleeding while on Xarelto®. To help avoid this, consult your healthcare provider or pharmacist prior to using any new prescription or non-prescription medications, including herbals, vitamins, non-steroidal anti-inflammatory drugs (NSAIDs) and supplements. ° °This website has more information on Xarelto®: www.xarelto.com. ° ° ° °

## 2018-02-19 NOTE — Interval H&P Note (Signed)
History and Physical Interval Note:  02/19/2018 9:35 AM  Troy Cruz  has presented today for surgery, with the diagnosis of right hip osteoarthritis  The various methods of treatment have been discussed with the patient and family. After consideration of risks, benefits and other options for treatment, the patient has consented to  Procedure(s) with comments: RIGHT TOTAL HIP ARTHROPLASTY ANTERIOR APPROACH (Right) - as a surgical intervention .  The patient's history has been reviewed, patient examined, no change in status, stable for surgery.  I have reviewed the patient's chart and labs.  Questions were answered to the patient's satisfaction.     [name] [name]

## 2018-02-19 NOTE — Evaluation (Signed)
Physical Therapy Evaluation Patient Details Name: Troy Cruz MRN: [id number] DOB: [birth year] Today's Date: 02/19/2018   History of Present Illness  Pt s/p R THR   Clinical Impression  Pt s/p R THR and presents with decreased R LE strength/ROM and post op pain limiting functional mobility.  Pt should progress to dc home with family assist.    Follow Up Recommendations Follow surgeon's recommendation for DC plan and follow-up therapies    Equipment Recommendations  None recommended by PT    Recommendations for Other Services       Precautions / Restrictions Precautions Precautions: Fall Restrictions Weight Bearing Restrictions: No Other Position/Activity Restrictions: WBAT      Mobility  Bed Mobility Overal bed mobility: Needs Assistance Bed Mobility: Supine to Sit     Supine to sit: Min assist     General bed mobility comments: cues for sequence and use of L LE to self assist  Transfers Overall transfer level: Needs assistance Equipment used: Rolling walker (2 wheeled) Transfers: Sit to/from Stand Sit to Stand: Min assist         General transfer comment: cues for LE management and use of UEs to self assist  Ambulation/Gait Ambulation/Gait assistance: Min assist Gait Distance (Feet): 120 Feet Assistive device: Rolling walker (2 wheeled) Gait Pattern/deviations: Step-to pattern;Step-through pattern;Decreased step length - right;Decreased step length - left;Shuffle;Trunk flexed     General Gait Details: cues for posture, position from RW and initial sequence  Stairs            Wheelchair Mobility    Modified Rankin (Stroke Patients Only)       Balance Overall balance assessment: Mild deficits observed, not formally tested                                           Pertinent Vitals/Pain Pain Assessment: 0-10 Pain Score: 4  Pain Location: R hip Pain Descriptors / Indicators: Aching;Sore Pain Intervention(s): Limited  activity within patient's tolerance;Monitored during session;Premedicated before session;Ice applied    Home Living Family/patient expects to be discharged to:: Private residence Living Arrangements: Spouse/significant other;Children Available Help at Discharge: Family Type of Home: House Home Access: Stairs to enter Entrance Stairs-Rails: None Entrance Stairs-Number of Steps: 4 Home Layout: One level Home Equipment: Environmental consultant - 2 wheels;Cane - single point;Bedside commode Additional Comments: Second entrance has 4 steps with rail but steeper climb    Prior Function Level of Independence: Independent               Hand Dominance        Extremity/Trunk Assessment   Upper Extremity Assessment Upper Extremity Assessment: Overall WFL for tasks assessed    Lower Extremity Assessment Lower Extremity Assessment: RLE deficits/detail    Cervical / Trunk Assessment Cervical / Trunk Assessment: Normal  Communication   Communication: No difficulties  Cognition Arousal/Alertness: Awake/alert Behavior During Therapy: WFL for tasks assessed/performed Overall Cognitive Status: Within Functional Limits for tasks assessed                                        General Comments      Exercises Total Joint Exercises Ankle Circles/Pumps: AROM;Both;15 reps;Supine   Assessment/Plan    PT Assessment Patient needs continued PT services  PT Problem List Decreased strength;Decreased range of motion;Decreased  activity tolerance;Decreased balance;Decreased mobility;Decreased knowledge of use of DME;Pain       PT Treatment Interventions DME instruction;Gait training;Stair training;Functional mobility training;Therapeutic activities;Therapeutic exercise;Patient/family education    PT Goals (Current goals can be found in the Care Plan section)  Acute Rehab PT Goals Patient Stated Goal: Regain IND PT Goal Formulation: With patient Time For Goal Achievement:  02/26/18 Potential to Achieve Goals: Good    Frequency 7X/week   Barriers to discharge        Co-evaluation               AM-PAC PT "6 Clicks" Mobility  Outcome Measure Help needed turning from your back to your side while in a flat bed without using bedrails?: A Little Help needed moving from lying on your back to sitting on the side of a flat bed without using bedrails?: A Little Help needed moving to and from a bed to a chair (including a wheelchair)?: A Little Help needed standing up from a chair using your arms (e.g., wheelchair or bedside chair)?: A Little Help needed to walk in hospital room?: A Little Help needed climbing 3-5 steps with a railing? : A Little 6 Click Score: 18    End of Session Equipment Utilized During Treatment: Gait belt Activity Tolerance: Patient tolerated treatment well Patient left: in chair;with call bell/phone within reach;with chair alarm set Nurse Communication: Mobility status PT Visit Diagnosis: Difficulty in walking, not elsewhere classified (R26.2)    Time: [id number] PT Time Calculation (min) (ACUTE ONLY): 24 min   Charges:   PT Evaluation $PT Eval Low Complexity: 1 Low PT Treatments $Gait Training: 8-22 mins        Mauro KaufmannHunter Jream Broyles PT Acute Rehabilitation Services Pager [phone] Office [phone]   Orene Abbasi 02/19/2018, 5:57 PM

## 2018-02-19 NOTE — Anesthesia Preprocedure Evaluation (Addendum)
Anesthesia Evaluation  Patient identified by MRN, date of birth, ID band Patient awake    Reviewed: Allergy & Precautions, NPO status , Patient's Chart, lab work & pertinent test results  History of Anesthesia Complications Negative for: history of anesthetic complications  Airway Mallampati: II  TM Distance: >3 FB Neck ROM: Full    Dental  (+) Upper Dentures, Lower Dentures   Pulmonary Current Smoker,    breath sounds clear to auscultation       Cardiovascular negative cardio ROS   Rhythm:Regular     Neuro/Psych PSYCHIATRIC DISORDERS Depression    GI/Hepatic Neg liver ROS, GERD  Medicated and Controlled,  Endo/Other  negative endocrine ROS  Renal/GU negative Renal ROS     Musculoskeletal  (+) Arthritis ,   Abdominal   Peds  Hematology negative hematology ROS (+)   Anesthesia Other Findings   Reproductive/Obstetrics                            Anesthesia Physical Anesthesia Plan  ASA: II  Anesthesia Plan: MAC and Spinal   Post-op Pain Management:    Induction: Intravenous  PONV Risk Score and Plan: 0 and Treatment may vary due to age or medical condition and Propofol infusion  Airway Management Planned: Nasal Cannula  Additional Equipment: None  Intra-op Plan:   Post-operative Plan:   Informed Consent: I have reviewed the patients History and Physical, chart, labs and discussed the procedure including the risks, benefits and alternatives for the proposed anesthesia with the patient or authorized representative who has indicated his/her understanding and acceptance.   Dental advisory given  Plan Discussed with: CRNA and Surgeon  Anesthesia Plan Comments:         Anesthesia Quick Evaluation

## 2018-02-20 ENCOUNTER — Encounter (HOSPITAL_COMMUNITY): Payer: Self-pay | Admitting: Orthopedic Surgery

## 2018-02-20 LAB — BASIC METABOLIC PANEL
ANION GAP: 10 (ref 5–15)
BUN: 10 mg/dL (ref 6–20)
CO2: 20 mmol/L — AB (ref 22–32)
Calcium: 8.4 mg/dL — ABNORMAL LOW (ref 8.9–10.3)
Chloride: 109 mmol/L (ref 98–111)
Creatinine, Ser: 0.7 mg/dL (ref 0.61–1.24)
GFR calc Af Amer: >60 mL/min (ref >60)
GFR calc non Af Amer: >60 mL/min (ref >60)
Glucose, Bld: 153 mg/dL — ABNORMAL HIGH (ref 70–99)
Potassium: 3.6 mmol/L (ref 3.5–5.1)
Sodium: 139 mmol/L (ref 135–145)

## 2018-02-20 LAB — CBC
HCT: 39.8 % (ref 39.0–52.0)
Hemoglobin: 13.2 g/dL (ref 13.0–17.0)
MCH: 28.9 pg (ref 26.0–34.0)
MCHC: 33.2 g/dL (ref 30.0–36.0)
MCV: 87.1 fL (ref 80.0–100.0)
Platelets: 226 10*3/uL (ref 150–400)
RBC: 4.57 MIL/uL (ref 4.22–5.81)
RDW: 13.7 % (ref 11.5–15.5)
WBC: 27.2 10*3/uL — ABNORMAL HIGH (ref 4.0–10.5)
nRBC: 0 % (ref 0.0–0.2)

## 2018-02-20 MED ORDER — METHOCARBAMOL 500 MG PO TABS: 500.0000 mg | ORAL_TABLET | Freq: Four times a day (QID) | ORAL | 0 refills | Status: AC | PRN

## 2018-02-20 MED ORDER — RIVAROXABAN 10 MG PO TABS: 10.0000 mg | ORAL_TABLET | Freq: Every day | ORAL | 0 refills | Status: AC

## 2018-02-20 MED ORDER — HYDROCODONE-ACETAMINOPHEN 5-325 MG PO TABS: 1.0000 | ORAL_TABLET | Freq: Four times a day (QID) | ORAL | 0 refills | Status: AC | PRN

## 2018-02-20 NOTE — Progress Notes (Signed)
   Subjective: 1 Day Post-Op Procedure(s) (LRB): RIGHT TOTAL HIP ARTHROPLASTY ANTERIOR APPROACH (Right) Patient reports pain as mild.   Patient seen in rounds by Dr. Lequita Halt. Patient is well, and has had no acute complaints or problems other than pain in the right hip. No acute events overnight. Foley catheter removed. Positive flatus. Denies CP, SHOB, or calf pain.  We will start therapy today.   Objective: Vital signs in last 24 hours: Temp:  [97.4 F (36.3 C)-97.9 F (36.6 C)] 97.9 F (36.6 C) (01/09 0515) Pulse Rate:  [62-91] 67 (01/09 0515) Resp:  [14-19] 16 (01/09 0515) BP: (91-127)/(55-91) 116/66 (01/09 0515) SpO2:  [94 %-100 %] 98 % (01/09 0515) Weight:  [74.8 kg] 74.8 kg (01/08 0936)  Intake/Output from previous day:  Intake/Output Summary (Last 24 hours) at 02/20/2018 0700 Last data filed at 02/20/2018 0600 Gross per 24 hour  Intake 4078.95 ml  Output 5600 ml  Net -1521.05 ml     Intake/Output this shift: Total I/O In: 1846.2 [P.O.:720; I.V.:1076.2; IV Piggyback:50] Out: 3880 [Urine:3850; Drains:30]  Labs: Recent Labs    02/20/18 0527  HGB 13.2   Recent Labs    02/20/18 0527  WBC 27.2*  RBC 4.57  HCT 39.8  PLT 226   Recent Labs    02/20/18 0527  NA 139  K 3.6  CL 109  CO2 20*  BUN 10  CREATININE 0.70  GLUCOSE 153*  CALCIUM 8.4*   No results for input(s): LABPT, INR in the last 72 hours.  Exam: General - Patient is Alert and Oriented Extremity - Neurologically intact Sensation intact distally Intact pulses distally Dorsiflexion/Plantar flexion intact Dressing - dressing C/D/I Motor Function - intact, moving foot and toes well on exam.   Past Medical History:  Diagnosis Date  . Arthritis    oa  . Depression   . GERD (gastroesophageal reflux disease)   . Pneumonia x 2 15 yrs ago    Assessment/Plan: 1 Day Post-Op Procedure(s) (LRB): RIGHT TOTAL HIP ARTHROPLASTY ANTERIOR APPROACH (Right) Principal Problem:   OA (osteoarthritis) of  hip  Estimated body mass index is 21.18 kg/m as calculated from the following:   Height as of this encounter: 6\' 2"  (1.88 m).   Weight as of this encounter: 74.8 kg. Advance diet Up with therapy  DVT Prophylaxis - Xarelto Weight bearing as tolerated. D/C O2 and pulse ox and try on room air. Hemovac pulled without difficulty, will begin therapy.  Plan is to go Home after hospital stay with home exercise program. Plan for discharge today after two sessions of therapy if he continues to meet goals. Follow up with Dr. Lequita Halt in the clinic in two weeks.   Dennie Bible, PA-C Orthopedic Surgery 02/20/2018, 7:00 AM

## 2018-02-20 NOTE — Progress Notes (Signed)
hemovac was noted to have been pulled out on rounds

## 2018-02-20 NOTE — Progress Notes (Signed)
Physical Therapy Treatment Patient Details Name: Troy Cruz MRN: [id number] DOB: 10-28-1965 Today's Date: 02/20/2018    History of Present Illness Pt s/p R THR     PT Comments    Pt continues very cooperative but fatigued.  Spouse present to review car transfers, stairs and home therex program - written instruction provided.   Follow Up Recommendations  Follow surgeon's recommendation for DC plan and follow-up therapies     Equipment Recommendations  None recommended by PT    Recommendations for Other Services       Precautions / Restrictions Precautions Precautions: Fall Restrictions Weight Bearing Restrictions: No Other Position/Activity Restrictions: WBAT    Mobility  Bed Mobility               General bed mobility comments: Pt up in chair and requests back to same  Transfers Overall transfer level: Needs assistance Equipment used: Rolling walker (2 wheeled) Transfers: Sit to/from Stand Sit to Stand: Supervision         General transfer comment: cues for LE management and use of UEs to self assist  Ambulation/Gait Ambulation/Gait assistance: Min guard;Supervision Gait Distance (Feet): 160 Feet Assistive device: Rolling walker (2 wheeled) Gait Pattern/deviations: Step-to pattern;Step-through pattern;Decreased step length - right;Decreased step length - left;Shuffle;Trunk flexed     General Gait Details: cues for posture, position from RW and initial sequence   Stairs Stairs: Yes Stairs assistance: Min assist Stair Management: No rails;Step to pattern;Backwards;With walker Number of Stairs: 6 General stair comments: 3 stairs twice with RW bkwd; cues for sequence and foot placement; spouse assisting on second attempt; written instruction provided   Wheelchair Mobility    Modified Rankin (Stroke Patients Only)       Balance Overall balance assessment: Mild deficits observed, not formally tested                                           Cognition Arousal/Alertness: Awake/alert Behavior During Therapy: WFL for tasks assessed/performed Overall Cognitive Status: Within Functional Limits for tasks assessed                                        Exercises Total Joint Exercises Ankle Circles/Pumps: AROM;Both;15 reps;Supine Quad Sets: AROM;Both;10 reps;Supine Heel Slides: AAROM;Right;10 reps;Supine Hip ABduction/ADduction: AAROM;Right;15 reps;Supine Long Arc Quad: AAROM;AROM;Right;5 reps;Seated    General Comments        Pertinent Vitals/Pain Pain Assessment: 0-10 Pain Score: 5  Pain Location: R hip Pain Descriptors / Indicators: Aching;Sore Pain Intervention(s): Limited activity within patient's tolerance;Monitored during session;Premedicated before session    Home Living                      Prior Function            PT Goals (current goals can now be found in the care plan section) Acute Rehab PT Goals Patient Stated Goal: Regain IND PT Goal Formulation: With patient Time For Goal Achievement: 02/26/18 Potential to Achieve Goals: Good Progress towards PT goals: Progressing toward goals    Frequency    7X/week      PT Plan Current plan remains appropriate    Co-evaluation              AM-PAC PT "6 Clicks" Mobility   Outcome Measure  Help needed turning from your back to your side while in a flat bed without using bedrails?: A Little Help needed moving from lying on your back to sitting on the side of a flat bed without using bedrails?: A Little Help needed moving to and from a bed to a chair (including a wheelchair)?: A Little Help needed standing up from a chair using your arms (e.g., wheelchair or bedside chair)?: A Little Help needed to walk in hospital room?: A Little Help needed climbing 3-5 steps with a railing? : A Little 6 Click Score: 18    End of Session Equipment Utilized During Treatment: Gait belt Activity Tolerance: Patient  tolerated treatment well Patient left: in chair;with call bell/phone within reach;with chair alarm set Nurse Communication: Mobility status PT Visit Diagnosis: Difficulty in walking, not elsewhere classified (R26.2)     Time: 1113-1202 PT Time Calculation (min) (ACUTE ONLY): 49 min  Charges:  $Gait Training: 8-22 mins $Therapeutic Exercise: 8-22 mins $Therapeutic Activity: 8-22 mins                     Mauro Kaufmann PT Acute Rehabilitation Services Pager [phone] Office [phone]    Jebediah Macrae 02/20/2018, 2:48 PM

## 2018-02-20 NOTE — Progress Notes (Signed)
Physical Therapy Treatment Patient Details Name: Troy Cruz MRN: [id number] DOB: [birth year] Today's Date: 02/20/2018    History of Present Illness Pt s/p R THR     PT Comments    Pt continues motivated and progressing steadily with mobility.  Pt hopeful for return home this date.   Follow Up Recommendations  Follow surgeon's recommendation for DC plan and follow-up therapies     Equipment Recommendations  None recommended by PT    Recommendations for Other Services       Precautions / Restrictions Precautions Precautions: Fall Restrictions Weight Bearing Restrictions: No Other Position/Activity Restrictions: WBAT    Mobility  Bed Mobility Overal bed mobility: Needs Assistance Bed Mobility: Supine to Sit     Supine to sit: Supervision     General bed mobility comments: cues for sequence and use of L LE to self assist  Transfers Overall transfer level: Needs assistance Equipment used: Rolling walker (2 wheeled) Transfers: Sit to/from Stand Sit to Stand: Min guard         General transfer comment: cues for LE management and use of UEs to self assist  Ambulation/Gait Ambulation/Gait assistance: Min guard Gait Distance (Feet): 200 Feet Assistive device: Rolling walker (2 wheeled) Gait Pattern/deviations: Step-to pattern;Step-through pattern;Decreased step length - right;Decreased step length - left;Shuffle;Trunk flexed     General Gait Details: cues for posture, position from RW and initial sequence   Stairs             Wheelchair Mobility    Modified Rankin (Stroke Patients Only)       Balance Overall balance assessment: Mild deficits observed, not formally tested                                          Cognition Arousal/Alertness: Awake/alert Behavior During Therapy: WFL for tasks assessed/performed Overall Cognitive Status: Within Functional Limits for tasks assessed                                         Exercises Total Joint Exercises Ankle Circles/Pumps: AROM;Both;15 reps;Supine Quad Sets: AROM;Both;10 reps;Supine Heel Slides: AAROM;Right;10 reps;Supine Hip ABduction/ADduction: AAROM;Right;15 reps;Supine    General Comments        Pertinent Vitals/Pain Pain Assessment: 0-10 Pain Score: 5  Pain Location: R hip Pain Descriptors / Indicators: Aching;Sore Pain Intervention(s): Limited activity within patient's tolerance;Monitored during session;Premedicated before session;Ice applied    Home Living                      Prior Function            PT Goals (current goals can now be found in the care plan section) Acute Rehab PT Goals Patient Stated Goal: Regain IND PT Goal Formulation: With patient Time For Goal Achievement: 02/26/18 Potential to Achieve Goals: Good Progress towards PT goals: Progressing toward goals    Frequency    7X/week      PT Plan Current plan remains appropriate    Co-evaluation              AM-PAC PT "6 Clicks" Mobility   Outcome Measure  Help needed turning from your back to your side while in a flat bed without using bedrails?: A Little Help needed moving from lying on your back to  sitting on the side of a flat bed without using bedrails?: A Little Help needed moving to and from a bed to a chair (including a wheelchair)?: A Little Help needed standing up from a chair using your arms (e.g., wheelchair or bedside chair)?: A Little Help needed to walk in hospital room?: A Little Help needed climbing 3-5 steps with a railing? : A Little 6 Click Score: 18    End of Session Equipment Utilized During Treatment: Gait belt Activity Tolerance: Patient tolerated treatment well Patient left: in chair;with call bell/phone within reach;with chair alarm set Nurse Communication: Mobility status PT Visit Diagnosis: Difficulty in walking, not elsewhere classified (R26.2)     Time: [id number] PT Time Calculation (min)  (ACUTE ONLY): 30 min  Charges:  $Gait Training: 8-22 mins $Therapeutic Exercise: 8-22 mins                     Troy KaufmannHunter Corene Cruz PT Acute Rehabilitation Services Pager [phone] Office [phone]    Troy Cruz 02/20/2018, 10:01 AM

## 2018-02-20 NOTE — Care Management Note (Signed)
Case Management Note  Patient Details  Name: [name] MRN: [id number] Date of Birth: Dec 27, 1965  Subjective/Objective:      Spoke with patient at bedside. Confirmed plan for HEP. Has RW and 3n1. [phone]              Action/Plan:   Expected Discharge Date:  02/20/18               Expected Discharge Plan:  Home/Self Care  In-House Referral:  NA  Discharge planning Services  CM Consult  Post Acute Care Choice:  NA Choice offered to:  Patient, Spouse  DME Arranged:  N/A DME Agency:  NA  HH Arranged:  NA HH Agency:  NA  Status of Service:  Completed, signed off  If discussed at Long Length of Stay Meetings, dates discussed:    Additional Comments:  Alexis Goodell, RN 02/20/2018, 10:02 AM

## 2018-02-21 ENCOUNTER — Encounter (HOSPITAL_COMMUNITY): Payer: Self-pay | Admitting: Orthopedic Surgery

## 2018-02-21 NOTE — Anesthesia Postprocedure Evaluation (Signed)
Anesthesia Post Note  Patient: Yannis Broce  Procedure(s) Performed: RIGHT TOTAL HIP ARTHROPLASTY ANTERIOR APPROACH (Right Hip)     Patient location during evaluation: PACU Anesthesia Type: MAC and Spinal Level of consciousness: awake and alert Pain management: pain level controlled Vital Signs Assessment: post-procedure vital signs reviewed and stable Respiratory status: spontaneous breathing, nonlabored ventilation, respiratory function stable and patient connected to nasal cannula oxygen Cardiovascular status: stable and blood pressure returned to baseline Postop Assessment: no apparent nausea or vomiting and spinal receding Anesthetic complications: no    Last Vitals:  Vitals:   02/20/18 0515 02/20/18 0943  BP: 116/66 114/72  Pulse: 67 63  Resp: 16 16  Temp: 36.6 C 36.7 C  SpO2: 98% 100%    Last Pain:  Vitals:   02/20/18 1237  TempSrc:   PainSc: 4                  Cassandr Cederberg

## 2018-02-24 NOTE — Discharge Summary (Signed)
Physician Discharge Summary   Patient ID: Troy Cruz MRN: [id number] DOB/AGE: [birth year] 53 y.o.  Admit date: 02/19/2018 Discharge date: 02/20/2018  Primary Diagnosis: Osteoarthritis of the right hip  Admission Diagnoses:  Past Medical History:  Diagnosis Date  . Arthritis    oa  . Depression   . GERD (gastroesophageal reflux disease)   . Pneumonia x 2 15 yrs ago   Discharge Diagnoses:   Principal Problem:   OA (osteoarthritis) of hip  Estimated body mass index is 21.18 kg/m as calculated from the following:   Height as of this encounter: 6\' 2"  (1.88 m).   Weight as of this encounter: 74.8 kg.  Procedure:  Procedure(s) (LRB): RIGHT TOTAL HIP ARTHROPLASTY ANTERIOR APPROACH (Right)   Consults: None  HPI: Troy Cruz is a 53 y.o. male who has advanced end-  stage arthritis of their Right  hip with progressively worsening pain and  dysfunction.The patient has failed nonoperative management and presents for  total hip arthroplasty.   Laboratory Data: Admission on 02/19/2018, Discharged on 02/20/2018  Component Date Value Ref Range Status  . WBC 02/20/2018 27.2* 4.0 - 10.5 K/uL Final  . RBC 02/20/2018 4.57  4.22 - 5.81 MIL/uL Final  . Hemoglobin 02/20/2018 13.2  13.0 - 17.0 g/dL Final  . HCT 40/98/1191 39.8  39.0 - 52.0 % Final  . MCV 02/20/2018 87.1  80.0 - 100.0 fL Final  . MCH 02/20/2018 28.9  26.0 - 34.0 pg Final  . MCHC 02/20/2018 33.2  30.0 - 36.0 g/dL Final  . RDW 47/82/9562 13.7  11.5 - 15.5 % Final  . Platelets 02/20/2018 226  150 - 400 K/uL Final  . nRBC 02/20/2018 0.0  0.0 - 0.2 % Final   Performed at [facility], [address], [place], [place] [address]  . Sodium 02/20/2018 139  135 - 145 mmol/L Final  . Potassium 02/20/2018 3.6  3.5 - 5.1 mmol/L Final  . Chloride 02/20/2018 109  98 - 111 mmol/L Final  . CO2 02/20/2018 20* 22 - 32 mmol/L Final  . Glucose, Bld 02/20/2018 153* 70 - 99 mg/dL Final  . BUN 57/84/6962 10  6 - 20 mg/dL  Final  . Creatinine, Ser 02/20/2018 0.70  0.61 - 1.24 mg/dL Final  . Calcium 95/28/4132 8.4* 8.9 - 10.3 mg/dL Final  . GFR calc non Af Amer 02/20/2018 >60  >60 mL/min Final  . GFR calc Af Amer 02/20/2018 >60  >60 mL/min Final  . Anion gap 02/20/2018 10  5 - 15 Final   Performed at [facility], [address], [place], [place] [address]  Hospital Outpatient Visit on 02/14/2018  Component Date Value Ref Range Status  . aPTT 02/14/2018 35  24 - 36 seconds Final   Performed at [facility], [address] [place], [place] [address]  . WBC 02/14/2018 10.8* 4.0 - 10.5 K/uL Final  . RBC 02/14/2018 5.38  4.22 - 5.81 MIL/uL Final  . Hemoglobin 02/14/2018 15.4  13.0 - 17.0 g/dL Final  . HCT 66/44/0347 47.5  39.0 - 52.0 % Final  . MCV 02/14/2018 88.3  80.0 - 100.0 fL Final  . MCH 02/14/2018 28.6  26.0 - 34.0 pg Final  . MCHC 02/14/2018 32.4  30.0 - 36.0 g/dL Final  . RDW 42/59/5638 14.2  11.5 - 15.5 % Final  . Platelets 02/14/2018 219  150 - 400 K/uL Final  . nRBC 02/14/2018 0.0  0.0 - 0.2 % Final   Performed  at [facility], [address] [place], [place] [address]  . Sodium 02/14/2018 139  135 - 145 mmol/L Final  . Potassium 02/14/2018 4.7  3.5 - 5.1 mmol/L Final  . Chloride 02/14/2018 103  98 - 111 mmol/L Final  . CO2 02/14/2018 24  22 - 32 mmol/L Final  . Glucose, Bld 02/14/2018 93  70 - 99 mg/dL Final  . BUN 16/60/6301 12  6 - 20 mg/dL Final  . Creatinine, Ser 02/14/2018 0.90  0.61 - 1.24 mg/dL Final  . Calcium 60/11/9321 9.5  8.9 - 10.3 mg/dL Final  . Total Protein 02/14/2018 7.3  6.5 - 8.1 g/dL Final  . Albumin 55/73/2202 4.0  3.5 - 5.0 g/dL Final  . AST 54/27/0623 26  15 - 41 U/L Final  . ALT 02/14/2018 19  0 - 44 U/L Final  . Alkaline Phosphatase 02/14/2018 92  38 - 126 U/L Final  . Total Bilirubin 02/14/2018 0.5  0.3 - 1.2 mg/dL Final  . GFR calc non Af Amer 02/14/2018 >60  >60 mL/min Final  . GFR calc Af Amer 02/14/2018  >60  >60 mL/min Final  . Anion gap 02/14/2018 12  5 - 15 Final   Performed at [facility], [address], [place], [place] [address]  . Prothrombin Time 02/14/2018 12.3  11.4 - 15.2 seconds Final  . INR 02/14/2018 0.92   Final   Performed at [facility], [address], [place], [place] [address]  . ABO/RH(D) 02/14/2018 A POS   Final  . Antibody Screen 02/14/2018 NEG   Final  . Sample Expiration 02/14/2018 02/22/2018   Final  . Extend sample reason 02/14/2018    Final                   Value:NO TRANSFUSIONS OR PREGNANCY IN THE PAST 3 MONTHS Performed at [facility], [address] [place], [place] [address]   . MRSA, PCR 02/14/2018 NEGATIVE  NEGATIVE Final  . Staphylococcus aureus 02/14/2018 NEGATIVE  NEGATIVE Final   Comment: (NOTE) The Xpert SA Assay (FDA approved for NASAL specimens in patients 32 years of age and older), is one component of a comprehensive surveillance program. It is not intended to diagnose infection nor to guide or monitor treatment. Performed at [facility], [address] [place], [place] [address]   . ABO/RH(D) 02/14/2018    Final                   Value:A POS Performed at [facility], [address] [place], [place] [address]      X-Rays:Dg Pelvis Portable  Result Date: 02/19/2018 CLINICAL DATA:  Status post right hip arthroplasty EXAM: PORTABLE PELVIS 1-2 VIEWS COMPARISON:  Intraoperative films from earlier in the same day. FINDINGS: Right hip replacement is seen. No acute bony or soft tissue abnormality is noted. Surgical drain is noted in place. Changes of prior left SI joint effusion noted. IMPRESSION: Status post right hip replacement. Electronically Signed   By: Alcide Clever M.D.   On: 02/19/2018 13:09   Dg C-arm 1-60 Min-no Report  Result Date: 02/19/2018 Fluoroscopy was utilized by the requesting physician.  No radiographic interpretation.   Dg  Hip Operative Unilat W Or W/o Pelvis Right  Result Date: 02/19/2018 CLINICAL DATA:  Right total hip replacement EXAM: OPERATIVE RIGHT HIP (WITH PELVIS IF PERFORMED) 4 VIEWS TECHNIQUE: Fluoroscopic spot image(s) were submitted for interpretation post-operatively. COMPARISON:  None. FINDINGS: Multiple intraoperative spot images demonstrate changes of right hip replacement. Normal AP alignment. No visible hardware bony complicating feature. IMPRESSION: Right hip replacement.  No visible complicating feature. Electronically Signed   By: Charlett Nose M.D.   On: 02/19/2018 12:06    EKG:No orders found for this or any previous visit.   Hospital Course: Troy Cruz is a 53 y.o. who was admitted to [facility]. They were brought to the operating room on 02/19/2018 and underwent Procedure(s): RIGHT TOTAL HIP ARTHROPLASTY ANTERIOR APPROACH.  Patient tolerated the procedure well and was later transferred to the recovery room and then to the orthopaedic floor for postoperative care. They were given PO and IV analgesics for pain control following their surgery. They were given 24 hours of postoperative antibiotics of  Anti-infectives (From admission, onward)   Start     Dose/Rate Route Frequency Ordered Stop   02/19/18 1800  terbinafine (LAMISIL) tablet 250 mg  Status:  Discontinued     250 mg Oral Every evening 02/19/18 1319 02/20/18 1541   02/19/18 1730  ceFAZolin (ANCEF) IVPB 1 g/50 mL premix     1 g 100 mL/hr over 30 Minutes Intravenous Every 6 hours 02/19/18 1319 02/20/18 0008   02/19/18 0915  ceFAZolin (ANCEF) IVPB 2g/100 mL premix     2 g 200 mL/hr over 30 Minutes Intravenous On call to O.R. 02/19/18 0914 02/19/18 1050     and started on DVT prophylaxis in the form of Xarelto.   PT and OT were ordered for total joint protocol. Discharge planning consulted to help with postop disposition and equipment needs.  Patient had a good night on the evening of surgery. They started to get up OOB with  therapy on POD #0. Continued therapy on POD #1. Pt was seen during rounds and was ready to go home pending progress with therapy. Hemovac drain was pulled without difficulty. He worked with therapy on POD #1 and was meeting his goals. Pt was discharged to home later that day in stable condition.  Diet: Regular diet Activity: WBAT Follow-up: in 2 weeks Disposition: Home Discharged Condition: good   Discharge Instructions    Call MD / Call 911   Complete by:  As directed    If you experience chest pain or shortness of breath, CALL 911 and be transported to the hospital emergency room.  If you develope a fever above 101 F, pus (white drainage) or increased drainage or redness at the wound, or calf pain, call your surgeon's office.   Change dressing   Complete by:  As directed    You may change your dressing on Friday, then change the dressing daily with sterile 4 x 4 inch gauze dressing and paper tape.   Constipation Prevention   Complete by:  As directed    Drink plenty of fluids.  Prune juice may be helpful.  You may use a stool softener, such as Colace (over the counter) 100 mg twice a day.  Use MiraLax (over the counter) for constipation as needed.   Diet - low sodium heart healthy   Complete by:  As directed    Discharge instructions   Complete by:  As directed    Troy Cruz Total Joint Specialist [facility] [address], [address] [place], [place] [address] [phone]  ANTERIOR APPROACH TOTAL HIP REPLACEMENT POSTOPERATIVE DIRECTIONS   Hip Rehabilitation, Guidelines Following Surgery  The results of a hip operation are greatly improved after range of motion and  muscle strengthening exercises. Follow all safety measures which are given to protect your hip. If any of these exercises cause increased pain or swelling in your joint, decrease the amount until you are comfortable again. Then slowly increase the exercises. Call your caregiver if you have problems or  questions.   HOME CARE INSTRUCTIONS  Remove items at home which could result in a fall. This includes throw rugs or furniture in walking pathways.  ICE to the affected hip every three hours for 30 minutes at a time and then as needed for pain and swelling.  Continue to use ice on the hip for pain and swelling from surgery. You may notice swelling that will progress down to the foot and ankle.  This is normal after surgery.  Elevate the leg when you are not up walking on it.   Continue to use the breathing machine which will help keep your temperature down.  It is common for your temperature to cycle up and down following surgery, especially at night when you are not up moving around and exerting yourself.  The breathing machine keeps your lungs expanded and your temperature down.  DIET You may resume your previous home diet once your are discharged from the hospital.  DRESSING / WOUND CARE / SHOWERING You may shower 3 days after surgery, but keep the wounds dry during showering.  You may use an occlusive plastic wrap (Press'n Seal for example), NO SOAKING/SUBMERGING IN THE BATHTUB.  If the bandage gets wet, change with a clean dry gauze.  If the incision gets wet, pat the wound dry with a clean towel. You may start showering once you are discharged home but do not submerge the incision under water. Just pat the incision dry and apply a dry gauze dressing on daily. Change the surgical dressing daily and reapply a dry dressing each time.  ACTIVITY Walk with your walker as instructed. Use walker as long as suggested by your caregivers. Avoid periods of inactivity such as sitting longer than an hour when not asleep. This helps prevent blood clots.  You may resume a sexual relationship in one month or when given the OK by your doctor.  You may return to work once you are cleared by your doctor.  Do not drive a car for 6 weeks or until released by you surgeon.  Do not drive while taking  narcotics.  WEIGHT BEARING Weight bearing as tolerated with assist device (walker, cane, etc) as directed, use it as long as suggested by your surgeon or therapist, typically at least 4-6 weeks.  POSTOPERATIVE CONSTIPATION PROTOCOL Constipation - defined medically as fewer than three stools per week and severe constipation as less than one stool per week.  One of the most common issues patients have following surgery is constipation.  Even if you have a regular bowel pattern at home, your normal regimen is likely to be disrupted due to multiple reasons following surgery.  Combination of anesthesia, postoperative narcotics, change in appetite and fluid intake all can affect your bowels.  In order to avoid complications following surgery, here are some recommendations in order to help you during your recovery period.  Colace (docusate) - Pick up an over-the-counter form of Colace or another stool softener and take twice a day as long as you are requiring postoperative pain medications.  Take with a full glass of water daily.  If you experience loose stools or diarrhea, hold the colace until you stool forms back up.  If your symptoms  do not get better within 1 week or if they get worse, check with your doctor.  Dulcolax (bisacodyl) - Pick up over-the-counter and take as directed by the product packaging as needed to assist with the movement of your bowels.  Take with a full glass of water.  Use this product as needed if not relieved by Colace only.   MiraLax (polyethylene glycol) - Pick up over-the-counter to have on hand.  MiraLax is a solution that will increase the amount of water in your bowels to assist with bowel movements.  Take as directed and can mix with a glass of water, juice, soda, coffee, or tea.  Take if you go more than two days without a movement. Do not use MiraLax more than once per day. Call your doctor if you are still constipated or irregular after using this medication for 7 days  in a row.  If you continue to have problems with postoperative constipation, please contact the office for further assistance and recommendations.  If you experience "the worst abdominal pain ever" or develop nausea or vomiting, please contact the office immediatly for further recommendations for treatment.  ITCHING  If you experience itching with your medications, try taking only a single pain pill, or even half a pain pill at a time.  You can also use Benadryl over the counter for itching or also to help with sleep.   TED HOSE STOCKINGS Wear the elastic stockings on both legs for three weeks following surgery during the day but you may remove then at night for sleeping.  MEDICATIONS See your medication summary on the "After Visit Summary" that the nursing staff will review with you prior to discharge.  You may have some home medications which will be placed on hold until you complete the course of blood thinner medication.  It is important for you to complete the blood thinner medication as prescribed by your surgeon.  Continue your approved medications as instructed at time of discharge.  PRECAUTIONS If you experience chest pain or shortness of breath - call 911 immediately for transfer to the hospital emergency department.  If you develop a fever greater that 101 F, purulent drainage from wound, increased redness or drainage from wound, foul odor from the wound/dressing, or calf pain - CONTACT YOUR SURGEON.                                                   FOLLOW-UP APPOINTMENTS Make sure you keep all of your appointments after your operation with your surgeon and caregivers. You should call the office at the above phone number and make an appointment for approximately two weeks after the date of your surgery or on the date instructed by your surgeon outlined in the "After Visit Summary".  RANGE OF MOTION AND STRENGTHENING EXERCISES  These exercises are designed to help you keep full  movement of your hip joint. Follow your caregiver's or physical therapist's instructions. Perform all exercises about fifteen times, three times per day or as directed. Exercise both hips, even if you have had only one joint replacement. These exercises can be done on a training (exercise) mat, on the floor, on a table or on a bed. Use whatever works the best and is most comfortable for you. Use music or television while you are exercising so that the exercises are a  pleasant break in your day. This will make your life better with the exercises acting as a break in routine you can look forward to.  Lying on your back, slowly slide your foot toward your buttocks, raising your knee up off the floor. Then slowly slide your foot back down until your leg is straight again.  Lying on your back spread your legs as far apart as you can without causing discomfort.  Lying on your side, raise your upper leg and foot straight up from the floor as far as is comfortable. Slowly lower the leg and repeat.  Lying on your back, tighten up the muscle in the front of your thigh (quadriceps muscles). You can do this by keeping your leg straight and trying to raise your heel off the floor. This helps strengthen the largest muscle supporting your knee.  Lying on your back, tighten up the muscles of your buttocks both with the legs straight and with the knee bent at a comfortable angle while keeping your heel on the floor.   IF YOU ARE TRANSFERRED TO A SKILLED REHAB FACILITY If the patient is transferred to a skilled rehab facility following release from the hospital, a list of the current medications will be sent to the facility for the patient to continue.  When discharged from the skilled rehab facility, please have the facility set up the patient's Home Health Physical Therapy prior to being released. Also, the skilled facility will be responsible for providing the patient with their medications at time of release from the  facility to include their pain medication, the muscle relaxants, and their blood thinner medication. If the patient is still at the rehab facility at time of the two week follow up appointment, the skilled rehab facility will also need to assist the patient in arranging follow up appointment in our office and any transportation needs.  MAKE SURE YOU:  Understand these instructions.  Get help right away if you are not doing well or get worse.    Pick up stool softner and laxative for home use following surgery while on pain medications. Do not submerge incision under water. Please use good hand washing techniques while changing dressing each day. May shower starting three days after surgery. Please use a clean towel to pat the incision dry following showers. Continue to use ice for pain and swelling after surgery. Do not use any lotions or creams on the incision until instructed by your surgeon.   Do not sit on low chairs, stoools or toilet seats, as it may be difficult to get up from low surfaces   Complete by:  As directed    Driving restrictions   Complete by:  As directed    No driving for two weeks   TED hose   Complete by:  As directed    Use stockings (TED hose) for three weeks on both leg(s).  You may remove them at night for sleeping.   Weight bearing as tolerated   Complete by:  As directed      Allergies as of 02/20/2018   No Known Allergies     Medication List    STOP taking these medications   GOODYS BODY PAIN PO     TAKE these medications   fluticasone 50 MCG/ACT nasal spray Commonly known as:  FLONASE Place 2 sprays into both nostrils daily as needed for allergies or rhinitis.   HYDROcodone-acetaminophen 5-325 MG tablet Commonly known as:  NORCO/VICODIN Take 1-2 tablets by mouth every  6 (six) hours as needed for moderate pain (pain score 4-6).   methocarbamol 500 MG tablet Commonly known as:  ROBAXIN Take 1 tablet (500 mg total) by mouth every 6 (six) hours  as needed for muscle spasms.   omeprazole 40 MG capsule Commonly known as:  PRILOSEC Take 40 mg by mouth daily.   rivaroxaban 10 MG Tabs tablet Commonly known as:  XARELTO Take 1 tablet (10 mg total) by mouth daily with breakfast for 20 days. Then take one 81 mg aspirin once a day for three weeks. Then discontinue aspirin.   terbinafine 250 MG tablet Commonly known as:  LAMISIL Take 250 mg by mouth every evening.            Discharge Care Instructions  (From admission, onward)         Start     Ordered   02/20/18 0000  Weight bearing as tolerated     02/20/18 0710   02/20/18 0000  Change dressing    Comments:  You may change your dressing on Friday, then change the dressing daily with sterile 4 x 4 inch gauze dressing and paper tape.   02/20/18 0710         Follow-up Information    Ollen Gross, MD. Schedule an appointment as soon as possible for a visit on 03/06/2018.   Specialty:  Orthopedic Surgery Contact information: [address] [place] [address] [phone]           Signed: Dennie Bible, PA-C Orthopedic Surgery 02/24/2018, 5:34 PM

## 2019-04-03 IMAGING — RF DG HIP (WITH PELVIS) OPERATIVE*R*
1 series · 8 of 8 positions shown · non-contrast
Comparison: None.

CLINICAL DATA: Right total hip replacement

EXAM:
OPERATIVE RIGHT HIP (WITH PELVIS IF PERFORMED) 4 VIEWS
TECHNIQUE: Fluoroscopic spot image(s) were submitted for interpretation
post-operatively.

[Series 1: unknown protocol · 0.20mm/px · 8 of 8 slices shown]
[im 1/8]
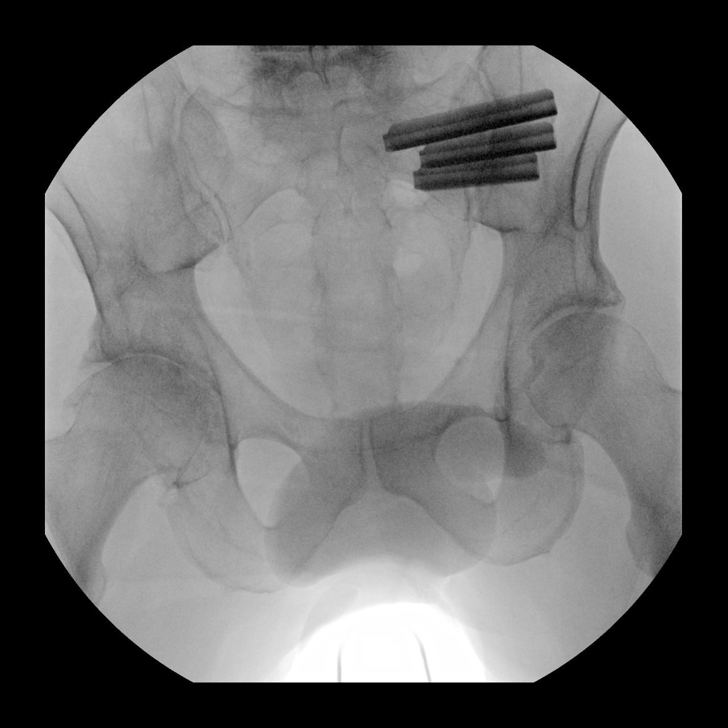
[im 2/8]
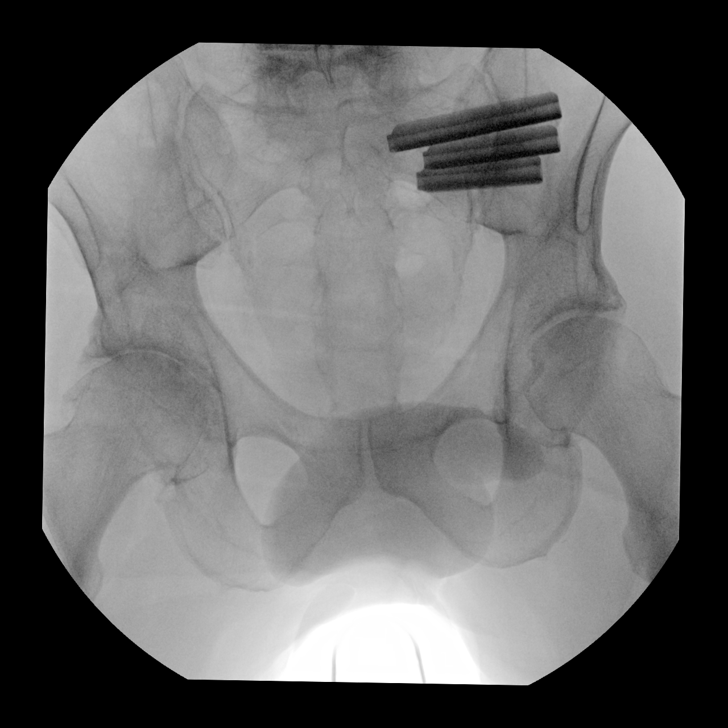
[im 3/8]
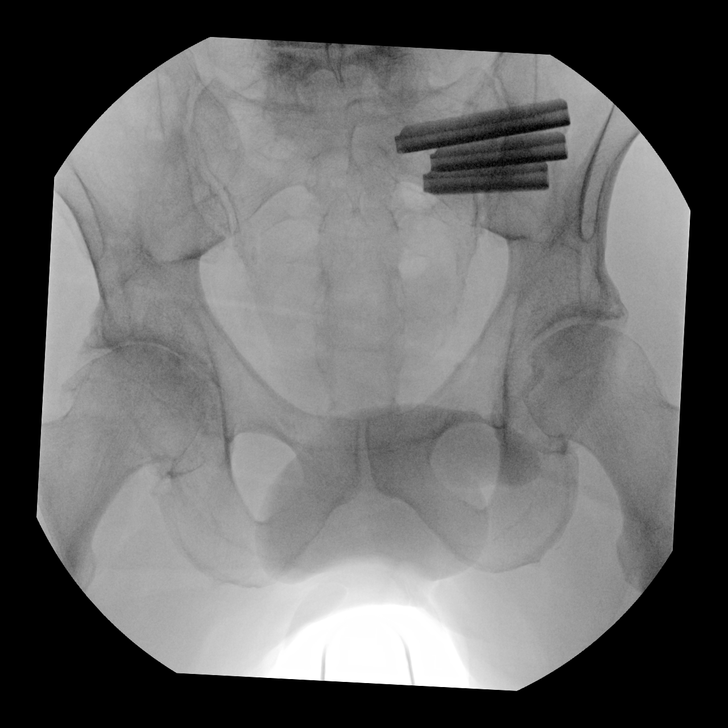
[im 4/8]
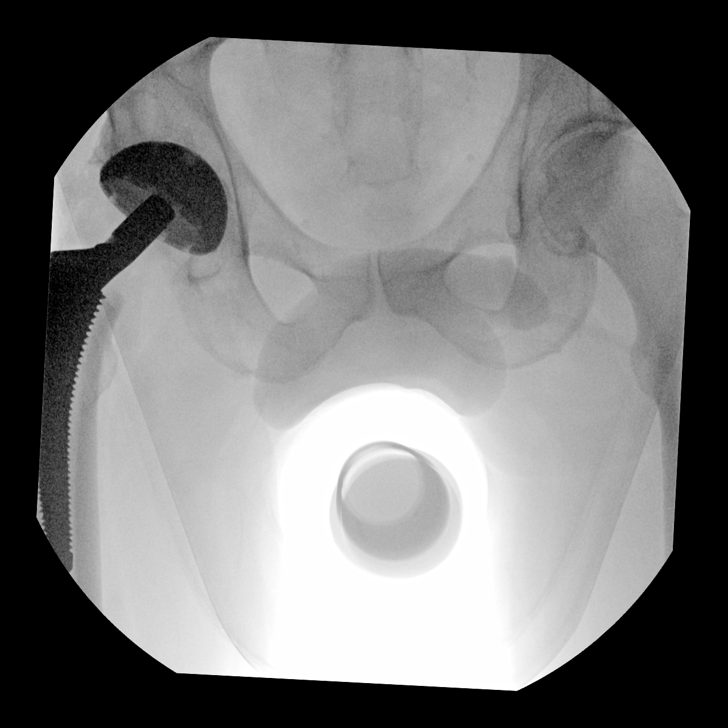
[im 5/8]
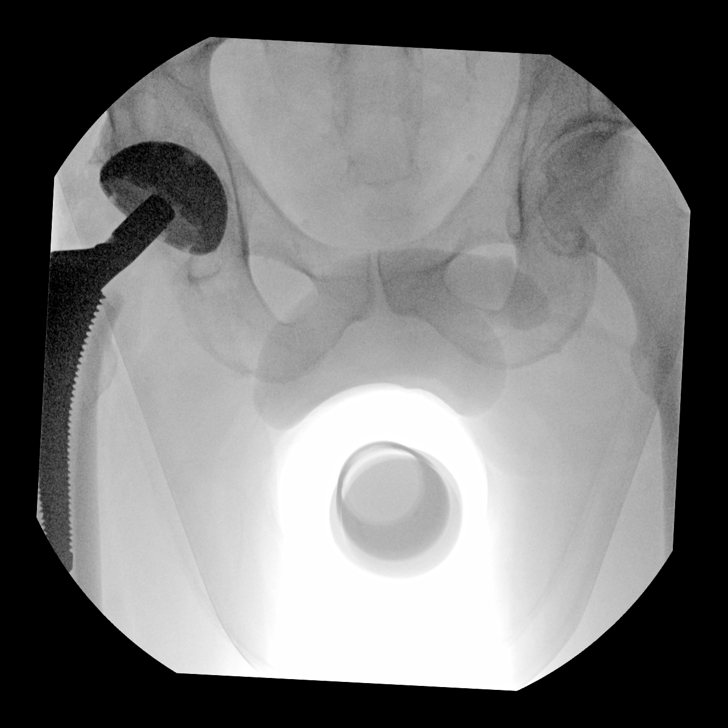
[im 6/8]
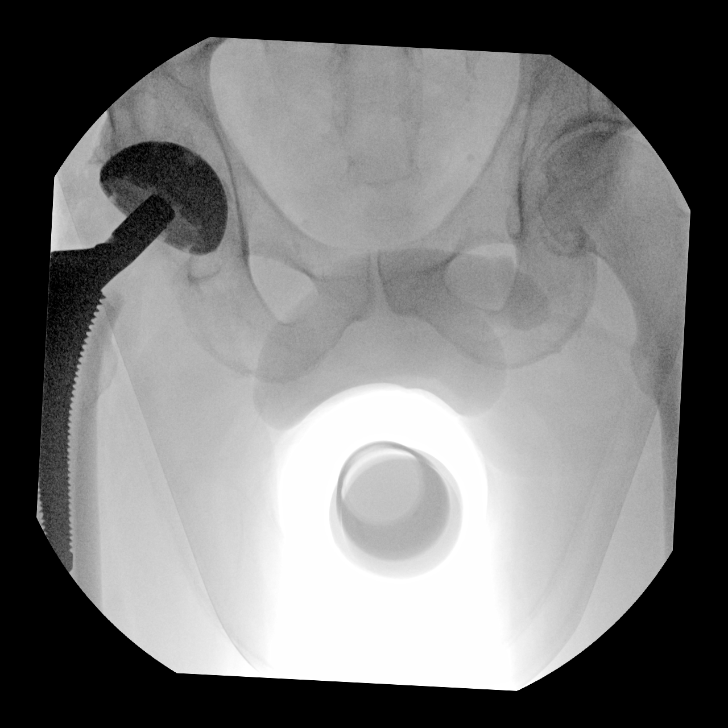
[im 7/8]
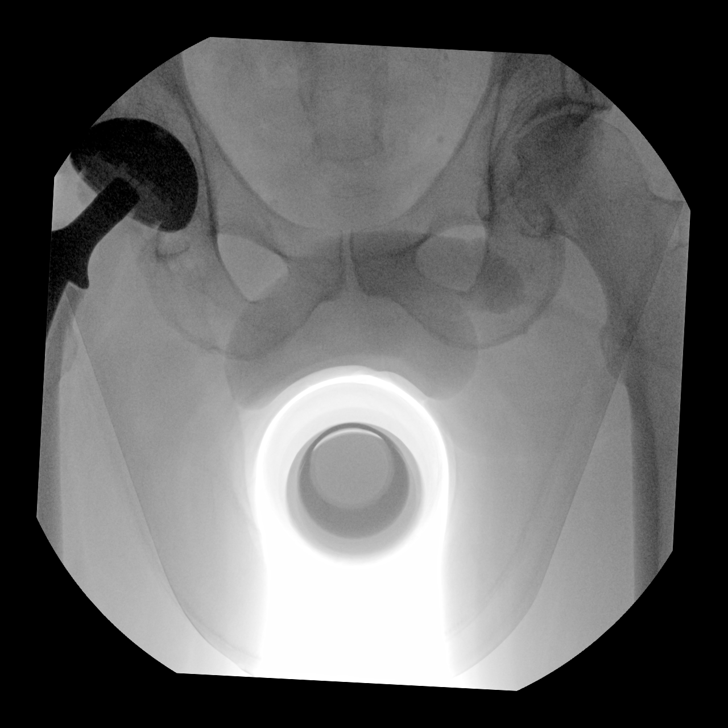
[im 8/8]
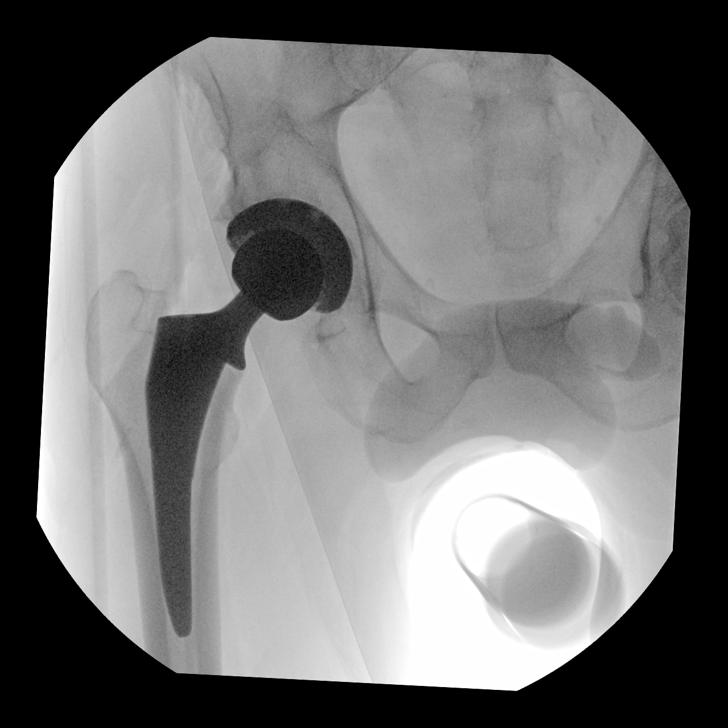

[8 of 8 positions shown; findings below may reference images not displayed]

FINDINGS: Multiple intraoperative spot images demonstrate changes of right hip
replacement. Normal AP alignment. No visible hardware bony
complicating feature.
IMPRESSION: Right hip replacement.  No visible complicating feature.
